# Patient Record
Sex: Female | Born: 1990 | Race: Black or African American | Hispanic: No | Marital: Single | Smoking: Never smoker
Health system: Southern US, Community
[De-identification: ages and names within clinical notes are randomized; demographics above are authoritative.]

## PROBLEM LIST (undated history)

## (undated) ENCOUNTER — Inpatient Hospital Stay: Payer: Self-pay

## (undated) DIAGNOSIS — Z862 Personal history of diseases of the blood and blood-forming organs and certain disorders involving the immune mechanism: Secondary | ICD-10-CM

## (undated) DIAGNOSIS — Z87440 Personal history of urinary (tract) infections: Secondary | ICD-10-CM

## (undated) DIAGNOSIS — L309 Dermatitis, unspecified: Secondary | ICD-10-CM

## (undated) DIAGNOSIS — R51 Headache: Secondary | ICD-10-CM

## (undated) DIAGNOSIS — D649 Anemia, unspecified: Secondary | ICD-10-CM

## (undated) DIAGNOSIS — R519 Headache, unspecified: Secondary | ICD-10-CM

## (undated) HISTORY — DX: Personal history of urinary (tract) infections: Z87.440

## (undated) HISTORY — DX: Headache: R51

## (undated) HISTORY — DX: Dermatitis, unspecified: L30.9

## (undated) HISTORY — DX: Headache, unspecified: R51.9

## (undated) HISTORY — DX: Personal history of diseases of the blood and blood-forming organs and certain disorders involving the immune mechanism: Z86.2

---
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
## 2010-08-06 ENCOUNTER — Ambulatory Visit: Payer: Self-pay | Admitting: Family Medicine

## 2010-10-20 ENCOUNTER — Observation Stay: Payer: Self-pay

## 2010-11-27 ENCOUNTER — Observation Stay: Payer: Self-pay

## 2011-01-09 ENCOUNTER — Inpatient Hospital Stay: Payer: Self-pay | Admitting: Obstetrics and Gynecology

## 2014-06-01 ENCOUNTER — Emergency Department: Payer: Self-pay | Admitting: Emergency Medicine

## 2015-12-28 NOTE — L&D Delivery Note (Signed)
Delivery Note At  0051 a viable and healthy female "Natalie Scott" was delivered via  (Presentation:OA ;  ).  APGAR:8 ,9 ; weight 6 # 12oz  .   Placenta status: delivered intact with three vessel Cord (delivered by myself):  with the following complications: precipitous vaginal delivery with Carlean JewsMeredith Sigmon, CNM in attendance, not enough time to administer GBS prophylaxis  Anesthesia:  none Episiotomy:  none Lacerations:  none Suture Repair: NA Est. Blood Loss (mL):  300  Mom to postpartum.  Baby to Couplet care / Skin to Skin.  Melody N Shambley 12/01/2016, 1:09 AM

## 2016-04-13 ENCOUNTER — Encounter: Payer: Self-pay | Admitting: Emergency Medicine

## 2016-04-13 ENCOUNTER — Emergency Department
Admission: EM | Admit: 2016-04-13 | Discharge: 2016-04-13 | Disposition: A | Discharge status: Home / Self Care | Payer: Medicaid Other | Attending: Emergency Medicine | Admitting: Emergency Medicine

## 2016-04-13 ENCOUNTER — Emergency Department: Payer: Medicaid Other

## 2016-04-13 DIAGNOSIS — R1030 Lower abdominal pain, unspecified: Secondary | ICD-10-CM | POA: Diagnosis present

## 2016-04-13 DIAGNOSIS — Z3490 Encounter for supervision of normal pregnancy, unspecified, unspecified trimester: Secondary | ICD-10-CM

## 2016-04-13 DIAGNOSIS — O26891 Other specified pregnancy related conditions, first trimester: Secondary | ICD-10-CM | POA: Diagnosis not present

## 2016-04-13 DIAGNOSIS — Z3A01 Less than 8 weeks gestation of pregnancy: Secondary | ICD-10-CM | POA: Diagnosis not present

## 2016-04-13 LAB — URINALYSIS COMPLETE WITH MICROSCOPIC (ARMC ONLY)
Bilirubin Urine: NEGATIVE
Glucose, UA: NEGATIVE mg/dL
Nitrite: NEGATIVE
Protein, ur: 100 mg/dL — AB
Specific Gravity, Urine: 1.029 (ref 1.005–1.030)
pH: 6 (ref 5.0–8.0)

## 2016-04-13 LAB — HCG, QUANTITATIVE, PREGNANCY: hCG, Beta Chain, Quant, S: 82075 m[IU]/mL — ABNORMAL HIGH

## 2016-04-13 LAB — COMPREHENSIVE METABOLIC PANEL
ALK PHOS: 41 U/L (ref 38–126)
ALT: 18 U/L (ref 14–54)
AST: 25 U/L (ref 15–41)
Albumin: 4 g/dL (ref 3.5–5.0)
Anion gap: 8 (ref 5–15)
BILIRUBIN TOTAL: 0.8 mg/dL (ref 0.3–1.2)
BUN: 10 mg/dL (ref 6–20)
CALCIUM: 9.4 mg/dL (ref 8.9–10.3)
CO2: 24 mmol/L (ref 22–32)
CREATININE: 0.6 mg/dL (ref 0.44–1.00)
Chloride: 103 mmol/L (ref 101–111)
GFR calc non Af Amer: >60 mL/min (ref >60)
GLUCOSE: 108 mg/dL — AB (ref 65–99)
Potassium: 3.3 mmol/L — ABNORMAL LOW (ref 3.5–5.1)
SODIUM: 135 mmol/L (ref 135–145)
Total Protein: 7.5 g/dL (ref 6.5–8.1)

## 2016-04-13 LAB — CBC
HCT: 32 % — ABNORMAL LOW (ref 35.0–47.0)
HEMOGLOBIN: 10.5 g/dL — AB (ref 12.0–16.0)
MCH: 25.9 pg — AB (ref 26.0–34.0)
MCHC: 32.7 g/dL (ref 32.0–36.0)
MCV: 79.2 fL — ABNORMAL LOW (ref 80.0–100.0)
PLATELETS: 241 10*3/uL (ref 150–440)
RBC: 4.04 MIL/uL (ref 3.80–5.20)
RDW: 15.7 % — ABNORMAL HIGH (ref 11.5–14.5)
WBC: 9.6 10*3/uL (ref 3.6–11.0)

## 2016-04-13 LAB — POCT PREGNANCY, URINE: Preg Test, Ur: POSITIVE — AB

## 2016-04-13 LAB — LIPASE, BLOOD: Lipase: 25 U/L (ref 11–51)

## 2016-04-13 MED ORDER — NITROFURANTOIN MACROCRYSTAL 100 MG PO CAPS: 100.0000 mg | ORAL_CAPSULE | Freq: Four times a day (QID) | ORAL | Status: DC

## 2016-04-13 NOTE — ED Provider Notes (Signed)
Time Seen: Approximately 1500 I have reviewed the triage notes  Chief Complaint: Abdominal Pain   History of Present Illness: Natalie Scott is a 25 y.o. female who is now currently gravida 2 para 1. She states her last normal menstrual period was approximately 8 weeks ago. She states she had some spotty vaginal bleeding 4 weeks ago and was considered to be an abnormal menstrual period. She describes some lower middle quadrant abdominal pain with nausea for the past 2 weeks. No persistent vomiting. She is also noticed some blood in her urine and some burning sensation when she urinates with urinary frequency. She denies any fever at home and she denies any loose stool or diarrhea, melena or hematochezia.   History reviewed. No pertinent past medical history. She states her last pregnancy was uneventful and had no issues with hypertension or diabetes There are no active problems to display for this patient.   History reviewed. No pertinent past surgical history.  History reviewed. No pertinent past surgical history.  No current outpatient prescriptions on file.  Allergies:  Review of patient's allergies indicates no known allergies.  Family History: History reviewed. No pertinent family history.  Social History: Social History  Substance Use Topics  . Smoking status: Never Smoker   . Smokeless tobacco: None  . Alcohol Use: No     Review of Systems:   10 point review of systems was performed and was otherwise negative:  Constitutional: No fever Eyes: No visual disturbances ENT: No sore throat, ear pain Cardiac: No chest pain Respiratory: No shortness of breath, wheezing, or stridor Abdomen: No abdominal pain, no vomiting, No diarrhea Endocrine: No weight loss, No night sweats Extremities: No peripheral edema, cyanosis Skin: No rashes, easy bruising Neurologic: No focal weakness, trouble with speech or swollowing Urologic: No dysuria, Hematuria, or urinary  frequency   Physical Exam:  ED Triage Vitals  Enc Vitals Group     BP 04/13/16 1157 113/77 mmHg     Pulse Rate 04/13/16 1157 86     Resp 04/13/16 1157 20     Temp 04/13/16 1157 98.3 F (36.8 C)     Temp Source 04/13/16 1157 Oral     SpO2 04/13/16 1157 100 %     Weight 04/13/16 1157 117 lb (53.071 kg)     Height 04/13/16 1157  (1.702 m)     Head Cir --      Peak Flow --      Pain Score 04/13/16 1158 3     Pain Loc --      Pain Edu? --      Excl. in GC? --     General: Awake , Alert , and Oriented times 3; GCS 15 Head: Normal cephalic , atraumatic Eyes: Pupils equal , round, reactive to light Nose/Throat: No nasal drainage, patent upper airway without erythema or exudate.  Neck: Supple, Full range of motion, No anterior adenopathy or palpable thyroid masses Lungs: Clear to ascultation without wheezes , rhonchi, or rales Heart: Regular rate, regular rhythm without murmurs , gallops , or rubs Abdomen: Soft, non tender without rebound, guarding , or rigidity; bowel sounds positive and symmetric in all 4 quadrants. No organomegaly .        Extremities: 2 plus symmetric pulses. No edema, clubbing or cyanosis Neurologic: normal ambulation, Motor symmetric without deficits, sensory intact Skin: warm, dry, no rashes Pelvic exam was deferred for ultrasound evaluation  Labs:   All laboratory work was reviewed including any pertinent  negatives or positives listed below:  Labs Reviewed  COMPREHENSIVE METABOLIC PANEL - Abnormal; Notable for the following:    Potassium 3.3 (*)    Glucose, Bld 108 (*)    All other components within normal limits  CBC - Abnormal; Notable for the following:    Hemoglobin 10.5 (*)    HCT 32.0 (*)    MCV 79.2 (*)    MCH 25.9 (*)    RDW 15.7 (*)    All other components within normal limits  URINALYSIS COMPLETEWITH MICROSCOPIC ([facility] ONLY) - Abnormal; Notable for the following:    Color, Urine YELLOW (*)    APPearance CLOUDY (*)    Ketones, ur 2+  (*)    Hgb urine dipstick 3+ (*)    Protein, ur 100 (*)    Leukocytes, UA 3+ (*)    Bacteria, UA FEW (*)    Squamous Epithelial / LPF 6-30 (*)    All other components within normal limits  POCT PREGNANCY, URINE - Abnormal; Notable for the following:    Preg Test, Ur POSITIVE (*)    All other components within normal limits  URINE CULTURE  LIPASE, BLOOD  HCG, QUANTITATIVE, PREGNANCY  POC URINE PREG, ED   and culture is pending  Radiology: *     EDC by LMP is12/13/2017.  EXAM: OBSTETRIC <14 WK US AND TRANSVAGINAL OB US  TECHNIQUE: Both transabdominal and transvaginal ultrasound examinations were performed for complete evaluation of the gestation as well as the maternal uterus, adnexal regions, and pelvic cul-de-sac. Transvaginal technique was performed to assess early pregnancy.  COMPARISON: None.  FINDINGS: Intrauterine gestational sac: Present  Yolk sac: Present  Embryo: Present  Cardiac Activity: Present  Heart Rate: 132 bpm  CRL: 6.3 mm  6 w  3 d         US EDC: 12/04/2016  Subchorionic hemorrhage: None  Maternal uterus/adnexae: Normal appearance of the left ovary. Mildly complex cystic structure within the right ovary, measuring 1.4 x 2.2 cm. Internal septations are present. Findings likely related to an involuting corpus luteum cyst. Trace free pelvic fluid.  IMPRESSION: 1. Single living intrauterine embryo. 2. Today's ultrasound confirms clinical EDC of 12/08/2016. 3. Probable right corpus luteum cyst.   Electronically Signed By: Norva PavlovElizabeth Brown M.D. On: 04/13/2016 16:38 I personally reviewed the radiologic studies   P    ED Course: * Patient's stay here was uneventful and she was evaluated for lower abdominal pain with pregnancy. She does not appear to have an ectopic pregnancy and does appear to have a urinary tract infection. Afebrile and earlier pregnancy and I felt there was no risk for pyelonephritis at this  time. She also does not appear to have any complications from her pregnancy at this time and was referred to OB/GYN unassigned. Last drink plenty of fluids and return here if she has any other new concerns.  Patient was prescribed nitrofurantoin for urinary tract infection    Assessment:  First trimester pregnancy Urinary tract infection   F  Plan:  Outpatient management Patient was advised to return immediately if condition worsens. Patient was advised to follow up with their primary care physician or other specialized physicians involved in their outpatient care. The patient and/or family member/power of attorney had laboratory results reviewed at the bedside. All questions and concerns were addressed and appropriate discharge instructions were distributed by the nursing staff.             Jennye MoccasinBrian S Quigley, MD 04/13/16 [phone]

## 2016-04-13 NOTE — ED Notes (Signed)
Pt to ed with c/o abd pain and nausea x 2 weeks.

## 2016-04-13 NOTE — ED Notes (Signed)
Pt informed to return if any life threatening symptoms occur.  

## 2016-04-13 NOTE — Discharge Instructions (Signed)
First Trimester of Pregnancy The first trimester of pregnancy is from week 1 until the end of week 12 (months 1 through 3). A week after a sperm fertilizes an egg, the egg will implant on the wall of the uterus. This embryo will begin to develop into a baby. Genes from you and your partner are forming the baby. The female genes determine whether the baby is a boy or a girl. At 6-8 weeks, the eyes and face are formed, and the heartbeat can be seen on ultrasound. At the end of 12 weeks, all the baby's organs are formed.  Now that you are pregnant, you will want to do everything you can to have a healthy baby. Two of the most important things are to get good prenatal care and to follow your health care provider's instructions. Prenatal care is all the medical care you receive before the baby's birth. This care will help prevent, find, and treat any problems during the pregnancy and childbirth. BODY CHANGES Your body goes through many changes during pregnancy. The changes vary from woman to woman.   You may gain or lose a couple of pounds at first.  You may feel sick to your stomach (nauseous) and throw up (vomit). If the vomiting is uncontrollable, call your health care provider.  You may tire easily.  You may develop headaches that can be relieved by medicines approved by your health care provider.  You may urinate more often. Painful urination may mean you have a bladder infection.  You may develop heartburn as a result of your pregnancy.  You may develop constipation because certain hormones are causing the muscles that push waste through your intestines to slow down.  You may develop hemorrhoids or swollen, bulging veins (varicose veins).  Your breasts may begin to grow larger and become tender. Your nipples may stick out more, and the tissue that surrounds them (areola) may become darker.  Your gums may bleed and may be sensitive to brushing and flossing.  Dark spots or blotches (chloasma,  mask of pregnancy) may develop on your face. This will likely fade after the baby is born.  Your menstrual periods will stop.  You may have a loss of appetite.  You may develop cravings for certain kinds of food.  You may have changes in your emotions from day to day, such as being excited to be pregnant or being concerned that something may go wrong with the pregnancy and baby.  You may have more vivid and strange dreams.  You may have changes in your hair. These can include thickening of your hair, rapid growth, and changes in texture. Some women also have hair loss during or after pregnancy, or hair that feels dry or thin. Your hair will most likely return to normal after your baby is born. WHAT TO EXPECT AT YOUR PRENATAL VISITS During a routine prenatal visit:  You will be weighed to make sure you and the baby are growing normally.  Your blood pressure will be taken.  Your abdomen will be measured to track your baby's growth.  The fetal heartbeat will be listened to starting around week 10 or 12 of your pregnancy.  Test results from any previous visits will be discussed. Your health care provider may ask you:  How you are feeling.  If you are feeling the baby move.  If you have had any abnormal symptoms, such as leaking fluid, bleeding, severe headaches, or abdominal cramping.  If you are using any tobacco products,   including cigarettes, chewing tobacco, and electronic cigarettes.  If you have any questions. Other tests that may be performed during your first trimester include:  Blood tests to find your blood type and to check for the presence of any previous infections. They will also be used to check for low iron levels (anemia) and Rh antibodies. Later in the pregnancy, blood tests for diabetes will be done along with other tests if problems develop.  Urine tests to check for infections, diabetes, or protein in the urine.  An ultrasound to confirm the proper growth  and development of the baby.  An amniocentesis to check for possible genetic problems.  Fetal screens for spina bifida and Down syndrome.  You may need other tests to make sure you and the baby are doing well.  HIV (human immunodeficiency virus) testing. Routine prenatal testing includes screening for HIV, unless you choose not to have this test. HOME CARE INSTRUCTIONS  Medicines  Follow your health care provider's instructions regarding medicine use. Specific medicines may be either safe or unsafe to take during pregnancy.  Take your prenatal vitamins as directed.  If you develop constipation, try taking a stool softener if your health care provider approves. Diet  Eat regular, well-balanced meals. Choose a variety of foods, such as meat or vegetable-based protein, fish, milk and low-fat dairy products, vegetables, fruits, and whole grain breads and cereals. Your health care provider will help you determine the amount of weight gain that is right for you.  Avoid raw meat and uncooked cheese. These carry germs that can cause birth defects in the baby.  Eating four or five small meals rather than three large meals a day may help relieve nausea and vomiting. If you start to feel nauseous, eating a few soda crackers can be helpful. Drinking liquids between meals instead of during meals also seems to help nausea and vomiting.  If you develop constipation, eat more high-fiber foods, such as fresh vegetables or fruit and whole grains. Drink enough fluids to keep your urine clear or pale yellow. Activity and Exercise  Exercise only as directed by your health care provider. Exercising will help you:  Control your weight.  Stay in shape.  Be prepared for labor and delivery.  Experiencing pain or cramping in the lower abdomen or low back is a good sign that you should stop exercising. Check with your health care provider before continuing normal exercises.  Try to avoid standing for long  periods of time. Move your legs often if you must stand in one place for a long time.  Avoid heavy lifting.  Wear low-heeled shoes, and practice good posture.  You may continue to have sex unless your health care provider directs you otherwise. Relief of Pain or Discomfort  Wear a good support bra for breast tenderness.   Take warm sitz baths to soothe any pain or discomfort caused by hemorrhoids. Use hemorrhoid cream if your health care provider approves.   Rest with your legs elevated if you have leg cramps or low back pain.  If you develop varicose veins in your legs, wear support hose. Elevate your feet for 15 minutes, 3-4 times a day. Limit salt in your diet. Prenatal Care  Schedule your prenatal visits by the twelfth week of pregnancy. They are usually scheduled monthly at first, then more often in the last 2 months before delivery.  Write down your questions. Take them to your prenatal visits.  Keep all your prenatal visits as directed by your   health care provider. Safety  Wear your seat belt at all times when driving.  Make a list of emergency phone numbers, including numbers for family, friends, the hospital, and police and fire departments. General Tips  Ask your health care provider for a referral to a local prenatal education class. Begin classes no later than at the beginning of month 6 of your pregnancy.  Ask for help if you have counseling or nutritional needs during pregnancy. Your health care provider can offer advice or refer you to specialists for help with various needs.  Do not use hot tubs, steam rooms, or saunas.  Do not douche or use tampons or scented sanitary pads.  Do not cross your legs for long periods of time.  Avoid cat litter boxes and soil used by cats. These carry germs that can cause birth defects in the baby and possibly loss of the fetus by miscarriage or stillbirth.  Avoid all smoking, herbs, alcohol, and medicines not prescribed by  your health care provider. Chemicals in these affect the formation and growth of the baby.  Do not use any tobacco products, including cigarettes, chewing tobacco, and electronic cigarettes. If you need help quitting, ask your health care provider. You may receive counseling support and other resources to help you quit.  Schedule a dentist appointment. At home, brush your teeth with a soft toothbrush and be gentle when you floss. SEEK MEDICAL CARE IF:   You have dizziness.  You have mild pelvic cramps, pelvic pressure, or nagging pain in the abdominal area.  You have persistent nausea, vomiting, or diarrhea.  You have a bad smelling vaginal discharge.  You have pain with urination.  You notice increased swelling in your face, hands, legs, or ankles. SEEK IMMEDIATE MEDICAL CARE IF:   You have a fever.  You are leaking fluid from your vagina.  You have spotting or bleeding from your vagina.  You have severe abdominal cramping or pain.  You have rapid weight gain or loss.  You vomit blood or material that looks like coffee grounds.  You are exposed to German measles and have never had them.  You are exposed to fifth disease or chickenpox.  You develop a severe headache.  You have shortness of breath.  You have any kind of trauma, such as from a fall or a car accident.   This information is not intended to replace advice given to you by your health care provider. Make sure you discuss any questions you have with your health care provider.   Document Released: 12/07/2001 Document Revised: 01/03/2015 Document Reviewed: 10/23/2013 Elsevier Interactive Patient Education 2016 Elsevier Inc.  

## 2016-04-15 LAB — URINE CULTURE

## 2016-04-27 ENCOUNTER — Ambulatory Visit (INDEPENDENT_AMBULATORY_CARE_PROVIDER_SITE_OTHER): Payer: Medicaid Other | Admitting: Obstetrics and Gynecology

## 2016-04-27 VITALS — BP 108/67 | HR 93 | Wt 117.2 lb

## 2016-04-27 DIAGNOSIS — Z1389 Encounter for screening for other disorder: Secondary | ICD-10-CM

## 2016-04-27 DIAGNOSIS — Z331 Pregnant state, incidental: Secondary | ICD-10-CM

## 2016-04-27 DIAGNOSIS — Z36 Encounter for antenatal screening of mother: Secondary | ICD-10-CM

## 2016-04-27 DIAGNOSIS — Z369 Encounter for antenatal screening, unspecified: Secondary | ICD-10-CM

## 2016-04-27 DIAGNOSIS — Z3490 Encounter for supervision of normal pregnancy, unspecified, unspecified trimester: Secondary | ICD-10-CM

## 2016-04-27 DIAGNOSIS — Z113 Encounter for screening for infections with a predominantly sexual mode of transmission: Secondary | ICD-10-CM

## 2016-04-27 NOTE — Progress Notes (Signed)
Natalie Scott presents for NOB nurse interview visit. Confirmation received from ER visit on 04/13/2016, UPT: positive. Pt had went to ER originally for back pain and then found out she was pregnant.  Had ultrasound with noted viable pregnancy on 04/13/2016 with EDD: 12/04/2016. LMP: 03/03/2016. Pt did not want to give fob name because she is no longer involved with him. He wanted her to have an abortion. G-2.  P-1001. Pregnancy education material explained and given.  No cats in the home. NOB labs ordered.  HIV labs and Drug screen were explained optional and she could opt out of tests but did not decline. Drug screen ordered. PNV encouraged. NT to discuss with provider. Pt given sample of pnv (Concept DHA) and to take otc vitamin B6 3xd and unisom at bedtime. If she wishes we can order phenergan or zofran if this does not help.  Pt. To follow up with provider in 4 weeks for NOB physical.  All questions answered.    ZIKA EXPOSURE SCREEN:  The patient has not traveled to a [place] endemic area within the past 6 months, nor has she had unprotected sex with a partner who has travelled to a [place] endemic region within the past 6 months. The patient has been advised to notify us if these factors change any time during this current pregnancy, so adequate testing and monitoring can be initiated.

## 2016-04-27 NOTE — Addendum Note (Signed)
Addended by: Jackquline DenmarkIDGEWAY, Jaquawn Saffran W on: 04/27/2016 12:42 PM   Modules accepted: Orders

## 2016-04-28 ENCOUNTER — Other Ambulatory Visit: Payer: Self-pay | Admitting: Obstetrics and Gynecology

## 2016-04-28 DIAGNOSIS — Z283 Underimmunization status: Secondary | ICD-10-CM | POA: Insufficient documentation

## 2016-04-28 DIAGNOSIS — O09899 Supervision of other high risk pregnancies, unspecified trimester: Secondary | ICD-10-CM

## 2016-04-28 DIAGNOSIS — Z2839 Other underimmunization status: Secondary | ICD-10-CM

## 2016-04-28 DIAGNOSIS — O9989 Other specified diseases and conditions complicating pregnancy, childbirth and the puerperium: Principal | ICD-10-CM

## 2016-04-28 LAB — RUBELLA ANTIBODY, IGM

## 2016-04-28 LAB — URINALYSIS, ROUTINE W REFLEX MICROSCOPIC
Bilirubin, UA: NEGATIVE
Glucose, UA: NEGATIVE
Ketones, UA: NEGATIVE
NITRITE UA: NEGATIVE
PH UA: 6 (ref 5.0–7.5)
PROTEIN UA: NEGATIVE
RBC, UA: NEGATIVE
Specific Gravity, UA: 1.023 (ref 1.005–1.030)
UUROB: 0.2 mg/dL (ref 0.2–1.0)

## 2016-04-28 LAB — CBC WITH DIFFERENTIAL/PLATELET
BASOS ABS: 0 10*3/uL (ref 0.0–0.2)
Basos: 0 %
EOS (ABSOLUTE): 0 10*3/uL (ref 0.0–0.4)
Eos: 0 %
Hematocrit: 34.9 % (ref 34.0–46.6)
Hemoglobin: 11.4 g/dL (ref 11.1–15.9)
Immature Grans (Abs): 0 10*3/uL (ref 0.0–0.1)
Immature Granulocytes: 0 %
LYMPHS ABS: 1.7 10*3/uL (ref 0.7–3.1)
Lymphs: 17 %
MCH: 26.3 pg — ABNORMAL LOW (ref 26.6–33.0)
MCHC: 32.7 g/dL (ref 31.5–35.7)
MCV: 80 fL (ref 79–97)
MONOS ABS: 0.9 10*3/uL (ref 0.1–0.9)
Monocytes: 9 %
NEUTROS PCT: 74 %
Neutrophils Absolute: 7.2 10*3/uL — ABNORMAL HIGH (ref 1.4–7.0)
PLATELETS: 296 10*3/uL (ref 150–379)
RBC: 4.34 x10E6/uL (ref 3.77–5.28)
RDW: 16.1 % — ABNORMAL HIGH (ref 12.3–15.4)
WBC: 9.8 10*3/uL (ref 3.4–10.8)

## 2016-04-28 LAB — HEPATITIS B SURFACE ANTIGEN: Hepatitis B Surface Ag: NEGATIVE

## 2016-04-28 LAB — NICOTINE SCREEN, URINE: COTININE UR QL SCN: NEGATIVE ng/mL

## 2016-04-28 LAB — MICROSCOPIC EXAMINATION

## 2016-04-28 LAB — HIV ANTIBODY (ROUTINE TESTING W REFLEX): HIV Screen 4th Generation wRfx: NONREACTIVE

## 2016-04-28 LAB — VARICELLA ZOSTER ANTIBODY, IGM

## 2016-04-28 LAB — SICKLE CELL SCREEN: Sickle Cell Screen: NEGATIVE

## 2016-04-28 LAB — RPR: RPR Ser Ql: NONREACTIVE

## 2016-04-28 LAB — RH TYPE: Rh Factor: POSITIVE

## 2016-04-28 LAB — ANTIBODY SCREEN: ANTIBODY SCREEN: NEGATIVE

## 2016-04-28 LAB — ABO

## 2016-04-29 LAB — URINE CULTURE, OB REFLEX

## 2016-04-29 LAB — GC/CHLAMYDIA PROBE AMP
Chlamydia trachomatis, NAA: NEGATIVE
NEISSERIA GONORRHOEAE BY PCR: NEGATIVE

## 2016-04-29 LAB — CULTURE, OB URINE

## 2016-04-30 LAB — MONITOR DRUG PROFILE 14(MW)
AMPHETAMINE SCREEN URINE: NEGATIVE ng/mL
BARBITURATE SCREEN URINE: NEGATIVE ng/mL
BENZODIAZEPINE SCREEN, URINE: NEGATIVE ng/mL
Buprenorphine, Urine: NEGATIVE ng/mL
Cocaine (Metab) Scrn, Ur: NEGATIVE ng/mL
Creatinine(Crt), U: 233.4 mg/dL (ref 20.0–300.0)
Fentanyl, Urine: NEGATIVE pg/mL
MEPERIDINE SCREEN, URINE: NEGATIVE ng/mL
Methadone Screen, Urine: NEGATIVE ng/mL
OXYCODONE+OXYMORPHONE UR QL SCN: NEGATIVE ng/mL
Opiate Scrn, Ur: NEGATIVE ng/mL
Ph of Urine: 5.7 (ref 4.5–8.9)
Phencyclidine Qn, Ur: NEGATIVE ng/mL
Propoxyphene Scrn, Ur: NEGATIVE ng/mL
SPECIFIC GRAVITY: 1.026
TRAMADOL SCREEN, URINE: NEGATIVE ng/mL

## 2016-04-30 LAB — CANNABINOID (GC/MS), URINE
CANNABINOID UR: POSITIVE — AB
Carboxy THC (GC/MS): 240 ng/mL

## 2016-05-04 ENCOUNTER — Other Ambulatory Visit: Payer: Self-pay | Admitting: Obstetrics and Gynecology

## 2016-05-04 DIAGNOSIS — F1290 Cannabis use, unspecified, uncomplicated: Secondary | ICD-10-CM | POA: Insufficient documentation

## 2016-05-13 ENCOUNTER — Other Ambulatory Visit: Payer: Self-pay | Admitting: Obstetrics and Gynecology

## 2016-05-27 ENCOUNTER — Ambulatory Visit (INDEPENDENT_AMBULATORY_CARE_PROVIDER_SITE_OTHER): Payer: Medicaid Other

## 2016-05-27 ENCOUNTER — Ambulatory Visit (INDEPENDENT_AMBULATORY_CARE_PROVIDER_SITE_OTHER): Payer: Medicaid Other | Admitting: Obstetrics and Gynecology

## 2016-05-27 ENCOUNTER — Other Ambulatory Visit: Payer: Self-pay | Admitting: Obstetrics and Gynecology

## 2016-05-27 ENCOUNTER — Encounter: Payer: Self-pay | Admitting: Obstetrics and Gynecology

## 2016-05-27 VITALS — BP 101/58 | HR 102 | Wt 126.7 lb

## 2016-05-27 DIAGNOSIS — Z3492 Encounter for supervision of normal pregnancy, unspecified, second trimester: Secondary | ICD-10-CM | POA: Diagnosis not present

## 2016-05-27 DIAGNOSIS — Z3481 Encounter for supervision of other normal pregnancy, first trimester: Secondary | ICD-10-CM | POA: Diagnosis not present

## 2016-05-27 LAB — POCT URINALYSIS DIPSTICK
Bilirubin, UA: NEGATIVE
Glucose, UA: NEGATIVE
KETONES UA: NEGATIVE
Leukocytes, UA: NEGATIVE
Nitrite, UA: NEGATIVE
PH UA: 7
RBC UA: NEGATIVE
SPEC GRAV UA: 1.015
Urobilinogen, UA: 0.2

## 2016-05-27 NOTE — Patient Instructions (Signed)

## 2016-05-27 NOTE — Progress Notes (Signed)
NOB- pt states she feels like she is further along, " almost feels like she feels baby in two different spots, twins run in the family"

## 2016-05-27 NOTE — Progress Notes (Signed)
Indications:First Trimester Screen - NT Findings:  Singleton intrauterine pregnancy is visualized with a CRL consistent with 13 1/[redacted] weeks gestation, giving an (U/S) EDD of 12/01/16.  FHR: 157 CRL measurement: 69.1 mm NT measurement: 2.8 mm. Yolk sac and and early anatomy is normal.  Right Ovary measures 2.8 x 1.4 x 1.6 cm. It is normal in appearance. Left Ovary measures 3 x 1.2 x 2.1 cm. It is normal appearance. There is evidence of a corpus luteal cyst in the Left Survey of the adnexa demonstrates no adnexal masses. There is no free peritoneal fluid in the cul de sac.  Impression: 1. 13 1/7 week Viable Singleton Intrauterine pregnancy by U/S. 2. (U/S) EDD is consistent with  12/01/16. 3. NT Screen successfully completed.

## 2016-06-02 ENCOUNTER — Telehealth: Payer: Self-pay | Admitting: Obstetrics and Gynecology

## 2016-06-02 NOTE — Telephone Encounter (Signed)
Patient needs script sent in for prenatal vitamins sent to [facility] on [address].Thanks

## 2016-06-03 MED ORDER — PRENATAL MULTIVITAMIN CH: 1.0000 | ORAL_TABLET | Freq: Every day | ORAL | Status: DC

## 2016-06-03 MED ORDER — CONCEPT OB 130-92.4-1 MG PO CAPS: 1.0000 | ORAL_CAPSULE | Freq: Every day | ORAL | Status: DC

## 2016-06-03 NOTE — Telephone Encounter (Signed)
KC made pt aware - med erx.

## 2016-06-03 NOTE — Addendum Note (Signed)
Addended by: Marchelle FolksMILLER, Karessa Onorato G on: 06/03/2016 04:52 PM   Modules accepted: Orders

## 2016-06-10 LAB — FIRST TRIMESTER SCREEN W/NT
CRL: 69.1 mm
DIA Value: <30 pg/mL
GEST AGE-COLLECT: 12.9 wk
MATERNAL AGE AT EDD: 27.5 a
NUCHAL TRANSLUCENCY: 2.8 mm
Nuchal Translucency MoM: 1.64
Number of Fetuses: 1
PAPP-A VALUE: 1.6 ng/mL
PDF: 0
Test Results:: POSITIVE — AB
WEIGHT: 117 [lb_av]

## 2016-06-15 ENCOUNTER — Ambulatory Visit (INDEPENDENT_AMBULATORY_CARE_PROVIDER_SITE_OTHER): Payer: Medicaid Other | Admitting: Obstetrics and Gynecology

## 2016-06-15 ENCOUNTER — Encounter: Payer: Self-pay | Admitting: Obstetrics and Gynecology

## 2016-06-15 VITALS — BP 117/73 | HR 111 | Wt 136.0 lb

## 2016-06-15 DIAGNOSIS — O283 Abnormal ultrasonic finding on antenatal screening of mother: Secondary | ICD-10-CM

## 2016-06-15 DIAGNOSIS — Z3492 Encounter for supervision of normal pregnancy, unspecified, second trimester: Secondary | ICD-10-CM

## 2016-06-15 DIAGNOSIS — O288 Other abnormal findings on antenatal screening of mother: Secondary | ICD-10-CM

## 2016-06-15 LAB — POCT URINALYSIS DIPSTICK
Bilirubin, UA: NEGATIVE
Blood, UA: NEGATIVE
Glucose, UA: NEGATIVE
Ketone: NEGATIVE
Nitrite, UA: NEGATIVE
PROTEIN UA: NEGATIVE
Spec Grav, UA: <=1.005
UROBILINOGEN UA: 0.2
pH, UA: 6

## 2016-06-15 NOTE — Progress Notes (Signed)
Chief complaint: 1.  Nuchal translucency test results.  Patient presents in the company of her partner for discussion of nuchal translucency testing results. Gestational age was confirmed by early ultrasound at 6.3 weeks on 04/13/2016.  Maternal age risk 1 in 30852. Screening Risk 1 in 6039  The risk for trisomy 18 is not increased.    ASSESSMENT: 1.  15.5 week intrauterine pregnancy. 2.  Increased risk for Down syndrome at 1 and 39, based on nuchal translucency screening.  PLAN: 1.  Questions answered. 2.  [facility]  A total of 15 minutes were spent face-to-face with the patient during this encounter and over half of that time dealt with counseling and coordination of care.  Herold HarmsMartin A Sophia Sperry, MD  Note: This dictation was prepared with Dragon dictation along with smaller phrase technology. Any transcriptional errors that result from this process are unintentional.

## 2016-06-15 NOTE — Patient Instructions (Addendum)
1. [facility] for [facility] counseling And Amniocentesis Due To Screen Positive Risk for Down Syndrome on Nuchal Translucency Testing

## 2016-06-16 ENCOUNTER — Other Ambulatory Visit: Payer: Self-pay | Admitting: Obstetrics and Gynecology

## 2016-06-16 DIAGNOSIS — O289 Unspecified abnormal findings on antenatal screening of mother: Secondary | ICD-10-CM

## 2016-06-17 ENCOUNTER — Other Ambulatory Visit: Payer: Self-pay | Admitting: Obstetrics and Gynecology

## 2016-06-17 DIAGNOSIS — Z3689 Encounter for other specified antenatal screening: Secondary | ICD-10-CM

## 2016-06-24 ENCOUNTER — Ambulatory Visit (INDEPENDENT_AMBULATORY_CARE_PROVIDER_SITE_OTHER): Payer: Medicaid Other | Admitting: Obstetrics and Gynecology

## 2016-06-24 ENCOUNTER — Encounter: Payer: Self-pay | Admitting: Obstetrics and Gynecology

## 2016-06-24 VITALS — BP 105/63 | HR 97 | Wt 138.6 lb

## 2016-06-24 DIAGNOSIS — Z3492 Encounter for supervision of normal pregnancy, unspecified, second trimester: Secondary | ICD-10-CM

## 2016-06-24 LAB — POCT URINALYSIS DIPSTICK
Bilirubin, UA: NEGATIVE
Blood, UA: NEGATIVE
Glucose, UA: NEGATIVE
KETONES UA: NEGATIVE
Nitrite, UA: NEGATIVE
PH UA: 6.5
PROTEIN UA: NEGATIVE
SPEC GRAV UA: 1.015
UROBILINOGEN UA: 0.2

## 2016-06-24 NOTE — Progress Notes (Signed)
ROB- still feeling fatigue and headaches-to try tylenol with caffeine and heat to neck- sounds tension.

## 2016-06-24 NOTE — Progress Notes (Signed)
ROB- pt is having headaches every day, tylenol isnt helping

## 2016-07-08 ENCOUNTER — Telehealth: Payer: Self-pay | Admitting: Obstetrics and Gynecology

## 2016-07-08 NOTE — Telephone Encounter (Signed)
Referring physician:  [facility]  I spoke with Ms. Laural BenesJohnson regarding her appointment scheduled for [facility] on Monday, July 17 for an ultrasound and MFM consultation.  She was not scheduled for genetic counseling to discuss her abnormal first trimester screening results and no genetic counselor will be available in clinic on Monday.  I therefore reviewed her first trimester screening results briefly by phone including the 1 in 39 risk for Down syndrome based upon the screening.  This result is unusual in that her hCG and DIA values were too low to be included in the risk assessment.  While this could be an accurate representation of this pregnancy, it is also suspicious for an issue with the sample itself.  The NT measurement was 2.538mm.  We spoke with [facility] and could have considered a repeat blood sample, but it is now too late for that given her gestational age.  I therefore offered her the options of ultrasound only, cell free DNA testing and amniocentesis.  The patient is not interested in amniocentesis due to the risks unless there are ultrasound findings or other labs suggestive of a chromosome condition.  She would like to proceed with her ultrasound here on Monday and have cell free DNA testing ordered at that time.  Dr. Leone BrandGrotegut will meet with her following her ultrasound on Monday to review those results and any questions.  He can schedule a formal genetic counseling visit for after her NIPT results become available if desired.  A formal family history and pregnancy history were not obtained during this call.  We can be reached at [phone].  Cherly Andersoneborah F. Tisha Cline, MS, CGC

## 2016-07-12 ENCOUNTER — Other Ambulatory Visit: Payer: Self-pay | Admitting: Obstetrics and Gynecology

## 2016-07-12 ENCOUNTER — Ambulatory Visit (HOSPITAL_BASED_OUTPATIENT_CLINIC_OR_DEPARTMENT_OTHER)
Admission: RE | Admit: 2016-07-12 | Disposition: A | Discharge status: Home / Self Care | Payer: Medicaid Other | Source: Ambulatory Visit | Attending: Obstetrics and Gynecology | Admitting: Obstetrics and Gynecology

## 2016-07-12 ENCOUNTER — Ambulatory Visit
Admission: RE | Admit: 2016-07-12 | Discharge: 2016-07-12 | Disposition: A | Discharge status: Home / Self Care | Payer: Medicaid Other | Source: Ambulatory Visit | Attending: Obstetrics & Gynecology | Admitting: Obstetrics & Gynecology

## 2016-07-12 VITALS — BP 105/65 | HR 93 | Temp 98.3°F | Resp 17 | Ht 67.5 in | Wt 144.0 lb

## 2016-07-12 DIAGNOSIS — Z315 Encounter for genetic counseling: Secondary | ICD-10-CM | POA: Insufficient documentation

## 2016-07-12 DIAGNOSIS — O9989 Other specified diseases and conditions complicating pregnancy, childbirth and the puerperium: Secondary | ICD-10-CM

## 2016-07-12 DIAGNOSIS — Z3A18 18 weeks gestation of pregnancy: Secondary | ICD-10-CM | POA: Diagnosis not present

## 2016-07-12 DIAGNOSIS — O289 Unspecified abnormal findings on antenatal screening of mother: Secondary | ICD-10-CM | POA: Diagnosis present

## 2016-07-12 DIAGNOSIS — O09899 Supervision of other high risk pregnancies, unspecified trimester: Secondary | ICD-10-CM

## 2016-07-12 DIAGNOSIS — Z283 Underimmunization status: Secondary | ICD-10-CM

## 2016-07-12 DIAGNOSIS — Z2839 Other underimmunization status: Secondary | ICD-10-CM

## 2016-07-12 DIAGNOSIS — Z3689 Encounter for other specified antenatal screening: Secondary | ICD-10-CM

## 2016-07-12 NOTE — Progress Notes (Signed)
[facility]: See ultrasound report.  Saul Fabiano, [place] A, MD

## 2016-07-18 LAB — INFORMASEQ(SM) WITH XY ANALYSIS
Fetal Fraction (%):: 9.7
Fetal Number: 1
Gestational Age at Collection: 19.7 wk
Weight: 144 [lb_av]

## 2016-07-19 ENCOUNTER — Telehealth: Payer: Self-pay | Admitting: Obstetrics and Gynecology

## 2016-07-19 NOTE — Telephone Encounter (Signed)
Natalie Scott was seen in the [facility] for a detailed ultrasound due to abnormal first trimester screening results which revealed an increased risk for Down syndrome.  Following the normal ultrasound and speaking with myself by phone prior to the visit, Dr. Leone Brand ordered InformaSeq testing to further assess for aneuploidy on July 12, 2016.  Today, the patient was informed of the results of her recent InformaSeq testing (performed at [facility]) which yielded NEGATIVE results.  The patient's specimen showed DNA consistent with two copies of chromosomes 21, 18 and 13.  The sensitivity for trisomy 43, trisomy 17 and trisomy 47 using this testing are reported as 99.1%, 98.3% and 98.1% respectively.  Thus, while the results of this testing are highly accurate, they are not considered diagnostic at this time.  Should more definitive information be desired, the patient may still consider amniocentesis.   As requested to know by the patient, sex chromosome analysis was included for this sample.  Results was consistent with a female fetus (XX). This is predicted with >97% accuracy.  A maternal serum AFP only should be considered if screening for neural tube defects is desired.  The patient declined additional formal genetic counseling and does not desired invasive prenatal diagnosis given these normal results. We are happy to speak with her further if desired and may be reachd at [phone].  Cherly Anderson, MS, CGC

## 2016-07-22 ENCOUNTER — Ambulatory Visit (INDEPENDENT_AMBULATORY_CARE_PROVIDER_SITE_OTHER): Payer: Medicaid Other | Admitting: Obstetrics and Gynecology

## 2016-07-22 VITALS — BP 116/68 | HR 102 | Wt 150.8 lb

## 2016-07-22 DIAGNOSIS — H10023 Other mucopurulent conjunctivitis, bilateral: Secondary | ICD-10-CM

## 2016-07-22 DIAGNOSIS — Z3492 Encounter for supervision of normal pregnancy, unspecified, second trimester: Secondary | ICD-10-CM

## 2016-07-22 LAB — POCT URINALYSIS DIPSTICK
Bilirubin, UA: NEGATIVE
GLUCOSE UA: NEGATIVE
KETONES UA: NEGATIVE
Nitrite, UA: NEGATIVE
Protein, UA: NEGATIVE
RBC UA: NEGATIVE
SPEC GRAV UA: 1.015
UROBILINOGEN UA: 0.2
pH, UA: 6

## 2016-07-22 MED ORDER — CIPROFLOXACIN-FLUOCINOLONE PF 0.3-0.025 % OT SOLN: 0.2500 mL | Freq: Two times a day (BID) | OTIC | Status: DC

## 2016-07-22 NOTE — Progress Notes (Signed)
ROB- pt is having B eye pain, matted in the am, feels like sand in her eyes x 2 days

## 2016-07-22 NOTE — Progress Notes (Signed)
ROB- reviewed anatomy scan and MFM consult. Normal female noted on scan. Will continue with routine prenatal care. Does have pink eye bilaterally and erythromycin oint. Not helping- switch to ciprodex drops and note given to excuse from work for 48 more hours.

## 2016-07-23 ENCOUNTER — Other Ambulatory Visit: Payer: Self-pay | Admitting: *Deleted

## 2016-07-23 MED ORDER — ERYTHROMYCIN 5 MG/GM OP OINT: 1.0000 "application " | TOPICAL_OINTMENT | Freq: Three times a day (TID) | OPHTHALMIC | 1 refills | Status: DC

## 2016-07-24 ENCOUNTER — Other Ambulatory Visit: Payer: Self-pay | Admitting: Obstetrics and Gynecology

## 2016-07-24 DIAGNOSIS — R8271 Bacteriuria: Secondary | ICD-10-CM | POA: Insufficient documentation

## 2016-07-24 LAB — URINE CULTURE

## 2016-07-24 MED ORDER — AMOXICILLIN 500 MG PO CAPS: 500.0000 mg | ORAL_CAPSULE | Freq: Three times a day (TID) | ORAL | 2 refills | Status: DC

## 2016-08-16 ENCOUNTER — Ambulatory Visit (INDEPENDENT_AMBULATORY_CARE_PROVIDER_SITE_OTHER): Payer: Medicaid Other | Admitting: Obstetrics and Gynecology

## 2016-08-16 VITALS — BP 118/68 | HR 105 | Wt 162.5 lb

## 2016-08-16 DIAGNOSIS — Z3492 Encounter for supervision of normal pregnancy, unspecified, second trimester: Secondary | ICD-10-CM

## 2016-08-16 LAB — POCT URINALYSIS DIPSTICK
Bilirubin, UA: NEGATIVE
Blood, UA: NEGATIVE
Glucose, UA: NEGATIVE
KETONES UA: NEGATIVE
NITRITE UA: NEGATIVE
Spec Grav, UA: 1.015
Urobilinogen, UA: 0.2
pH, UA: 6

## 2016-08-16 NOTE — Progress Notes (Signed)
ROB- dicussed possible anemia- will test at next visit.

## 2016-08-16 NOTE — Progress Notes (Signed)
ROB- pt is concerned about her weight gain, also c/o being very tired

## 2016-09-14 ENCOUNTER — Ambulatory Visit (INDEPENDENT_AMBULATORY_CARE_PROVIDER_SITE_OTHER): Payer: Medicaid Other | Admitting: Obstetrics and Gynecology

## 2016-09-14 ENCOUNTER — Other Ambulatory Visit: Payer: Self-pay | Admitting: *Deleted

## 2016-09-14 ENCOUNTER — Other Ambulatory Visit: Payer: Medicaid Other

## 2016-09-14 VITALS — BP 107/68 | HR 88 | Wt 168.4 lb

## 2016-09-14 DIAGNOSIS — A499 Bacterial infection, unspecified: Secondary | ICD-10-CM

## 2016-09-14 DIAGNOSIS — Z23 Encounter for immunization: Secondary | ICD-10-CM | POA: Diagnosis not present

## 2016-09-14 DIAGNOSIS — N760 Acute vaginitis: Secondary | ICD-10-CM

## 2016-09-14 DIAGNOSIS — B9689 Other specified bacterial agents as the cause of diseases classified elsewhere: Secondary | ICD-10-CM

## 2016-09-14 DIAGNOSIS — Z131 Encounter for screening for diabetes mellitus: Secondary | ICD-10-CM

## 2016-09-14 DIAGNOSIS — Z3492 Encounter for supervision of normal pregnancy, unspecified, second trimester: Secondary | ICD-10-CM | POA: Diagnosis not present

## 2016-09-14 LAB — POCT URINALYSIS DIPSTICK
Bilirubin, UA: NEGATIVE
GLUCOSE UA: NEGATIVE
Ketones, UA: NEGATIVE
LEUKOCYTES UA: NEGATIVE
NITRITE UA: NEGATIVE
RBC UA: NEGATIVE
Spec Grav, UA: 1.01
UROBILINOGEN UA: 0.2
pH, UA: 6.5

## 2016-09-14 MED ORDER — METRONIDAZOLE 500 MG PO TABS: 500.0000 mg | ORAL_TABLET | Freq: Two times a day (BID) | ORAL | 2 refills | Status: DC

## 2016-09-14 MED ORDER — TETANUS-DIPHTH-ACELL PERTUSSIS 5-2.5-18.5 LF-MCG/0.5 IM SUSP
0.5000 mL | Freq: Once | INTRAMUSCULAR | Status: AC
  Administered 2016-09-14: 0.5 mL via INTRAMUSCULAR

## 2016-09-14 NOTE — Patient Instructions (Signed)
Third Trimester of Pregnancy The third trimester is from week 29 through week 42, months 7 through 9. The third trimester is a time when the fetus is growing rapidly. At the end of the ninth month, the fetus is about 20 inches in length and weighs 6-10 pounds.  BODY CHANGES Your body goes through many changes during pregnancy. The changes vary from woman to woman.   Your weight will continue to increase. You can expect to gain 25-35 pounds (11-16 kg) by the end of the pregnancy.  You may begin to get stretch marks on your hips, abdomen, and breasts.  You may urinate more often because the fetus is moving lower into your pelvis and pressing on your bladder.  You may develop or continue to have heartburn as a result of your pregnancy.  You may develop constipation because certain hormones are causing the muscles that push waste through your intestines to slow down.  You may develop hemorrhoids or swollen, bulging veins (varicose veins).  You may have pelvic pain because of the weight gain and pregnancy hormones relaxing your joints between the bones in your pelvis. Backaches may result from overexertion of the muscles supporting your posture.  You may have changes in your hair. These can include thickening of your hair, rapid growth, and changes in texture. Some women also have hair loss during or after pregnancy, or hair that feels dry or thin. Your hair will most likely return to normal after your baby is born.  Your breasts will continue to grow and be tender. A yellow discharge may leak from your breasts called colostrum.  Your belly button may stick out.  You may feel short of breath because of your expanding uterus.  You may notice the fetus "dropping," or moving lower in your abdomen.  You may have a bloody mucus discharge. This usually occurs a few days to a week before labor begins.  Your cervix becomes thin and soft (effaced) near your due date. WHAT TO EXPECT AT YOUR PRENATAL  EXAMS  You will have prenatal exams every 2 weeks until week 36. Then, you will have weekly prenatal exams. During a routine prenatal visit:  You will be weighed to make sure you and the fetus are growing normally.  Your blood pressure is taken.  Your abdomen will be measured to track your baby's growth.  The fetal heartbeat will be listened to.  Any test results from the previous visit will be discussed.  You may have a cervical check near your due date to see if you have effaced. At around 36 weeks, your caregiver will check your cervix. At the same time, your caregiver will also perform a test on the secretions of the vaginal tissue. This test is to determine if a type of bacteria, Group B streptococcus, is present. Your caregiver will explain this further. Your caregiver may ask you:  What your birth plan is.  How you are feeling.  If you are feeling the baby move.  If you have had any abnormal symptoms, such as leaking fluid, bleeding, severe headaches, or abdominal cramping.  If you are using any tobacco products, including cigarettes, chewing tobacco, and electronic cigarettes.  If you have any questions. Other tests or screenings that may be performed during your third trimester include:  Blood tests that check for low iron levels (anemia).  Fetal testing to check the health, activity level, and growth of the fetus. Testing is done if you have certain medical conditions or if   there are problems during the pregnancy.  HIV (human immunodeficiency virus) testing. If you are at high risk, you may be screened for HIV during your third trimester of pregnancy. FALSE LABOR You may feel small, irregular contractions that eventually go away. These are called Braxton Hicks contractions, or false labor. Contractions may last for hours, days, or even weeks before true labor sets in. If contractions come at regular intervals, intensify, or become painful, it is best to be seen by your  caregiver.  SIGNS OF LABOR   Menstrual-like cramps.  Contractions that are 5 minutes apart or less.  Contractions that start on the top of the uterus and spread down to the lower abdomen and back.  A sense of increased pelvic pressure or back pain.  A watery or bloody mucus discharge that comes from the vagina. If you have any of these signs before the 37th week of pregnancy, call your caregiver right away. You need to go to the hospital to get checked immediately. HOME CARE INSTRUCTIONS   Avoid all smoking, herbs, alcohol, and unprescribed drugs. These chemicals affect the formation and growth of the baby.  Do not use any tobacco products, including cigarettes, chewing tobacco, and electronic cigarettes. If you need help quitting, ask your health care provider. You may receive counseling support and other resources to help you quit.  Follow your caregiver's instructions regarding medicine use. There are medicines that are either safe or unsafe to take during pregnancy.  Exercise only as directed by your caregiver. Experiencing uterine cramps is a good sign to stop exercising.  Continue to eat regular, healthy meals.  Wear a good support bra for breast tenderness.  Do not use hot tubs, steam rooms, or saunas.  Wear your seat belt at all times when driving.  Avoid raw meat, uncooked cheese, cat litter boxes, and soil used by cats. These carry germs that can cause birth defects in the baby.  Take your prenatal vitamins.  Take 1500-2000 mg of calcium daily starting at the 20th week of pregnancy until you deliver your baby.  Try taking a stool softener (if your caregiver approves) if you develop constipation. Eat more high-fiber foods, such as fresh vegetables or fruit and whole grains. Drink plenty of fluids to keep your urine clear or pale yellow.  Take warm sitz baths to soothe any pain or discomfort caused by hemorrhoids. Use hemorrhoid cream if your caregiver approves.  If  you develop varicose veins, wear support hose. Elevate your feet for 15 minutes, 3-4 times a day. Limit salt in your diet.  Avoid heavy lifting, wear low heal shoes, and practice good posture.  Rest a lot with your legs elevated if you have leg cramps or low back pain.  Visit your dentist if you have not gone during your pregnancy. Use a soft toothbrush to brush your teeth and be gentle when you floss.  A sexual relationship may be continued unless your caregiver directs you otherwise.  Do not travel far distances unless it is absolutely necessary and only with the approval of your caregiver.  Take prenatal classes to understand, practice, and ask questions about the labor and delivery.  Make a trial run to the hospital.  Pack your hospital bag.  Prepare the baby's nursery.  Continue to go to all your prenatal visits as directed by your caregiver. SEEK MEDICAL CARE IF:  You are unsure if you are in labor or if your water has broken.  You have dizziness.  You have  mild pelvic cramps, pelvic pressure, or nagging pain in your abdominal area.  You have persistent nausea, vomiting, or diarrhea.  You have a bad smelling vaginal discharge.  You have pain with urination. SEEK IMMEDIATE MEDICAL CARE IF:   You have a fever.  You are leaking fluid from your vagina.  You have spotting or bleeding from your vagina.  You have severe abdominal cramping or pain.  You have rapid weight loss or gain.  You have shortness of breath with chest pain.  You notice sudden or extreme swelling of your face, hands, ankles, feet, or legs.  You have not felt your baby move in over an hour.  You have severe headaches that do not go away with medicine.  You have vision changes.   This information is not intended to replace advice given to you by your health care provider. Make sure you discuss any questions you have with your health care provider.   Document Released: 12/07/2001 Document  Revised: 01/03/2015 Document Reviewed: 02/13/2013 Elsevier Interactive Patient Education 2016 Elsevier Inc.      Bacterial Vaginosis Bacterial vaginosis is a vaginal infection that occurs when the normal balance of bacteria in the vagina is disrupted. It results from an overgrowth of certain bacteria. This is the most common vaginal infection in women of childbearing age. Treatment is important to prevent complications, especially in pregnant women, as it can cause a premature delivery. CAUSES  Bacterial vaginosis is caused by an increase in harmful bacteria that are normally present in smaller amounts in the vagina. Several different kinds of bacteria can cause bacterial vaginosis. However, the reason that the condition develops is not fully understood. RISK FACTORS Certain activities or behaviors can put you at an increased risk of developing bacterial vaginosis, including:  Having a new sex partner or multiple sex partners.  Douching.  Using an intrauterine device (IUD) for contraception. Women do not get bacterial vaginosis from toilet seats, bedding, swimming pools, or contact with objects around them. SIGNS AND SYMPTOMS  Some women with bacterial vaginosis have no signs or symptoms. Common symptoms include:  Grey vaginal discharge.  A fishlike odor with discharge, especially after sexual intercourse.  Itching or burning of the vagina and vulva.  Burning or pain with urination. DIAGNOSIS  Your health care provider will take a medical history and examine the vagina for signs of bacterial vaginosis. A sample of vaginal fluid may be taken. Your health care provider will look at this sample under a microscope to check for bacteria and abnormal cells. A vaginal pH test may also be done.  TREATMENT  Bacterial vaginosis may be treated with antibiotic medicines. These may be given in the form of a pill or a vaginal cream. A second round of antibiotics may be prescribed if the condition  comes back after treatment. Because bacterial vaginosis increases your risk for sexually transmitted diseases, getting treated can help reduce your risk for chlamydia, gonorrhea, HIV, and herpes. HOME CARE INSTRUCTIONS   Only take over-the-counter or prescription medicines as directed by your health care provider.  If antibiotic medicine was prescribed, take it as directed. Make sure you finish it even if you start to feel better.  Tell all sexual partners that you have a vaginal infection. They should see their health care provider and be treated if they have problems, such as a mild rash or itching.  During treatment, it is important that you follow these instructions:  Avoid sexual activity or use condoms correctly.  Do  not douche.  Avoid alcohol as directed by your health care provider.  Avoid breastfeeding as directed by your health care provider. SEEK MEDICAL CARE IF:   Your symptoms are not improving after 3 days of treatment.  You have increased discharge or pain.  You have a fever. MAKE SURE YOU:   Understand these instructions.  Will watch your condition.  Will get help right away if you are not doing well or get worse. FOR MORE INFORMATION  Centers for Disease Control and Prevention, Division of STD Prevention: [employer] American Sexual Health Association (ASHA): www.ashastd.org    This information is not intended to replace advice given to you by your health care provider. Make sure you discuss any questions you have with your health care provider.   Document Released: 12/13/2005 Document Revised: 01/03/2015 Document Reviewed: 07/25/2013 Elsevier Interactive Patient Education [employer].

## 2016-09-14 NOTE — Progress Notes (Signed)
ROB- glucola done, Tdap & flu vaccine given; reviewed blood consent form.  Reports vaginal d/c with odor x 1 week.   Microscopic wet-mount exam shows KOH done, clue cells, excessive bacteria.RX sent in for Flagyl 500mg  bid x 5 days

## 2016-09-14 NOTE — Progress Notes (Signed)
ROB- glucola, blood consent signed, tdap & flu given

## 2016-09-15 ENCOUNTER — Other Ambulatory Visit: Payer: Self-pay | Admitting: Obstetrics and Gynecology

## 2016-09-15 DIAGNOSIS — D649 Anemia, unspecified: Secondary | ICD-10-CM | POA: Insufficient documentation

## 2016-09-15 LAB — HEMOGLOBIN AND HEMATOCRIT, BLOOD
HEMATOCRIT: 33.6 % — AB (ref 34.0–46.6)
HEMOGLOBIN: 10.8 g/dL — AB (ref 11.1–15.9)

## 2016-09-15 LAB — GLUCOSE, 1 HOUR GESTATIONAL: GESTATIONAL DIABETES SCREEN: 90 mg/dL (ref 65–139)

## 2016-09-15 MED ORDER — FUSION PLUS PO CAPS: 1.0000 | ORAL_CAPSULE | Freq: Every day | ORAL | 1 refills | Status: DC

## 2016-09-20 ENCOUNTER — Other Ambulatory Visit: Payer: Self-pay | Admitting: Obstetrics & Gynecology

## 2016-09-20 ENCOUNTER — Ambulatory Visit
Admission: RE | Admit: 2016-09-20 | Discharge: 2016-09-20 | Disposition: A | Discharge status: Home / Self Care | Payer: Medicaid Other | Source: Ambulatory Visit | Attending: Obstetrics & Gynecology | Admitting: Obstetrics & Gynecology

## 2016-09-20 DIAGNOSIS — O09899 Supervision of other high risk pregnancies, unspecified trimester: Secondary | ICD-10-CM

## 2016-09-20 DIAGNOSIS — O9989 Other specified diseases and conditions complicating pregnancy, childbirth and the puerperium: Secondary | ICD-10-CM

## 2016-09-20 DIAGNOSIS — O2693 Pregnancy related conditions, unspecified, third trimester: Secondary | ICD-10-CM | POA: Insufficient documentation

## 2016-09-20 DIAGNOSIS — Z2839 Other underimmunization status: Secondary | ICD-10-CM

## 2016-09-20 DIAGNOSIS — O289 Unspecified abnormal findings on antenatal screening of mother: Secondary | ICD-10-CM

## 2016-09-20 DIAGNOSIS — R8271 Bacteriuria: Secondary | ICD-10-CM

## 2016-09-20 DIAGNOSIS — Z3A28 28 weeks gestation of pregnancy: Secondary | ICD-10-CM | POA: Diagnosis not present

## 2016-09-20 DIAGNOSIS — Z283 Underimmunization status: Secondary | ICD-10-CM

## 2016-09-20 HISTORY — DX: Anemia, unspecified: D64.9

## 2016-09-23 DIAGNOSIS — O285 Abnormal chromosomal and genetic finding on antenatal screening of mother: Secondary | ICD-10-CM | POA: Insufficient documentation

## 2016-09-28 ENCOUNTER — Ambulatory Visit (INDEPENDENT_AMBULATORY_CARE_PROVIDER_SITE_OTHER): Payer: Medicaid Other | Admitting: Obstetrics and Gynecology

## 2016-09-28 ENCOUNTER — Other Ambulatory Visit: Payer: Self-pay | Admitting: Obstetrics and Gynecology

## 2016-09-28 VITALS — BP 115/69 | HR 85 | Wt 171.0 lb

## 2016-09-28 DIAGNOSIS — Z3493 Encounter for supervision of normal pregnancy, unspecified, third trimester: Secondary | ICD-10-CM

## 2016-09-28 LAB — POCT URINALYSIS DIPSTICK
Blood, UA: NEGATIVE
GLUCOSE UA: NEGATIVE
KETONES UA: NEGATIVE
Leukocytes, UA: NEGATIVE
Nitrite, UA: NEGATIVE
SPEC GRAV UA: 1.015
Urobilinogen, UA: 0.2
pH, UA: 6.5

## 2016-09-28 NOTE — Progress Notes (Signed)
Rob-doing well. [facility].plans epidural.

## 2016-09-28 NOTE — Progress Notes (Signed)
ROB- pt is doing well, hasnt started fusion plus-samples provided

## 2016-09-30 LAB — URINE CULTURE

## 2016-10-13 ENCOUNTER — Other Ambulatory Visit: Payer: Self-pay | Admitting: Obstetrics and Gynecology

## 2016-10-13 ENCOUNTER — Ambulatory Visit (INDEPENDENT_AMBULATORY_CARE_PROVIDER_SITE_OTHER): Payer: Medicaid Other | Admitting: Obstetrics and Gynecology

## 2016-10-13 VITALS — BP 121/76 | HR 90 | Wt 174.7 lb

## 2016-10-13 DIAGNOSIS — R12 Heartburn: Secondary | ICD-10-CM

## 2016-10-13 DIAGNOSIS — Z3493 Encounter for supervision of normal pregnancy, unspecified, third trimester: Secondary | ICD-10-CM

## 2016-10-13 DIAGNOSIS — O26899 Other specified pregnancy related conditions, unspecified trimester: Secondary | ICD-10-CM

## 2016-10-13 LAB — POCT URINALYSIS DIPSTICK
Bilirubin, UA: NEGATIVE
Glucose, UA: NEGATIVE
NITRITE UA: NEGATIVE
PH UA: 6.5
RBC UA: NEGATIVE
Spec Grav, UA: 1.01
UROBILINOGEN UA: 0.2

## 2016-10-13 MED ORDER — RANITIDINE HCL 150 MG PO TABS: 150.0000 mg | ORAL_TABLET | Freq: Two times a day (BID) | ORAL | 2 refills | Status: DC

## 2016-10-13 MED ORDER — FLUCONAZOLE 150 MG PO TABS: 150.0000 mg | ORAL_TABLET | Freq: Once | ORAL | 3 refills | Status: DC

## 2016-10-13 NOTE — Progress Notes (Signed)
ROB- pt is having a lot of pelvic pressure, some pain in lower abdomen, otherwise she is doing well

## 2016-10-13 NOTE — Progress Notes (Signed)
ROB-[facility] and [facility], has follow-up on Nov.11, has yeast infection from Flagyl- sent in prescription for Diflucan, heartburn nightly- added zantac.

## 2016-10-15 LAB — URINE CULTURE: Organism ID, Bacteria: NO GROWTH

## 2016-10-26 ENCOUNTER — Ambulatory Visit (INDEPENDENT_AMBULATORY_CARE_PROVIDER_SITE_OTHER): Payer: Medicaid Other | Admitting: Obstetrics and Gynecology

## 2016-10-26 VITALS — BP 104/77 | HR 100 | Wt 172.8 lb

## 2016-10-26 DIAGNOSIS — Z3493 Encounter for supervision of normal pregnancy, unspecified, third trimester: Secondary | ICD-10-CM

## 2016-10-26 LAB — POCT URINALYSIS DIPSTICK
BILIRUBIN UA: NEGATIVE
GLUCOSE UA: NEGATIVE
KETONES UA: NEGATIVE
NITRITE UA: NEGATIVE
PH UA: 6.5
RBC UA: NEGATIVE
SPEC GRAV UA: 1.015
Urobilinogen, UA: 0.2

## 2016-10-26 NOTE — Progress Notes (Signed)
ROB-doing well except mild back pain, reports continued d/c but itching resolved with diflucan, Microscopic wet-mount exam shows negative for pathogens, normal epithelial cells, lactobacilli. To take another dose of diflucan. Plans depo PP

## 2016-10-26 NOTE — Progress Notes (Signed)
ROB- pt is c/o low back pain, some pelvic pressure,

## 2016-10-27 ENCOUNTER — Other Ambulatory Visit: Payer: Self-pay | Admitting: Obstetrics and Gynecology

## 2016-11-09 ENCOUNTER — Ambulatory Visit (INDEPENDENT_AMBULATORY_CARE_PROVIDER_SITE_OTHER): Payer: Medicaid Other | Admitting: Obstetrics and Gynecology

## 2016-11-09 VITALS — BP 110/72 | HR 92 | Wt 177.4 lb

## 2016-11-09 DIAGNOSIS — Z113 Encounter for screening for infections with a predominantly sexual mode of transmission: Secondary | ICD-10-CM

## 2016-11-09 DIAGNOSIS — Z3493 Encounter for supervision of normal pregnancy, unspecified, third trimester: Secondary | ICD-10-CM

## 2016-11-09 DIAGNOSIS — Z3685 Encounter for antenatal screening for Streptococcus B: Secondary | ICD-10-CM

## 2016-11-09 LAB — POCT URINALYSIS DIPSTICK
Bilirubin, UA: NEGATIVE
Glucose, UA: NEGATIVE
Ketones, UA: 5
Leukocytes, UA: NEGATIVE
NITRITE UA: NEGATIVE
PH UA: 7
RBC UA: NEGATIVE
SPEC GRAV UA: 1.015
UROBILINOGEN UA: 0.2

## 2016-11-09 NOTE — Progress Notes (Signed)
ROB- cultures obtained, pt is having some pelvic pressure 

## 2016-11-09 NOTE — Progress Notes (Signed)
ROB- cultures obtained, will plan IOL after 39 weeks. Scheduled for 12/02/16

## 2016-11-11 LAB — GC/CHLAMYDIA PROBE AMP
CHLAMYDIA, DNA PROBE: NEGATIVE
NEISSERIA GONORRHOEAE BY PCR: NEGATIVE

## 2016-11-11 LAB — STREP GP B NAA: STREP GROUP B AG: POSITIVE — AB

## 2016-11-16 ENCOUNTER — Ambulatory Visit (INDEPENDENT_AMBULATORY_CARE_PROVIDER_SITE_OTHER): Payer: Medicaid Other | Admitting: Obstetrics and Gynecology

## 2016-11-16 VITALS — BP 123/67 | HR 84 | Wt 181.2 lb

## 2016-11-16 DIAGNOSIS — Z3493 Encounter for supervision of normal pregnancy, unspecified, third trimester: Secondary | ICD-10-CM

## 2016-11-16 LAB — POCT URINALYSIS DIPSTICK
BILIRUBIN UA: NEGATIVE
Blood, UA: NEGATIVE
GLUCOSE UA: NEGATIVE
KETONES UA: NEGATIVE
LEUKOCYTES UA: NEGATIVE
Nitrite, UA: NEGATIVE
Protein, UA: NEGATIVE
Spec Grav, UA: <=1.005
Urobilinogen, UA: 0.2
pH, UA: 6

## 2016-11-16 NOTE — Progress Notes (Signed)
ROB- Overall doing well. Reports discharge still present but has not picked up her Diflucan yet. Will follow up in 1 week for ROB.

## 2016-11-16 NOTE — Progress Notes (Signed)
I assessed the patient with the student and agree with plan of care. Melody TomballShambely, CNM

## 2016-11-16 NOTE — Progress Notes (Signed)
ROB- pt is having a lot of pelvic pressure, low back pain, still having d/c

## 2016-11-23 ENCOUNTER — Ambulatory Visit (INDEPENDENT_AMBULATORY_CARE_PROVIDER_SITE_OTHER): Payer: Medicaid Other | Admitting: Obstetrics and Gynecology

## 2016-11-23 VITALS — BP 100/63 | HR 101 | Wt 178.6 lb

## 2016-11-23 DIAGNOSIS — K649 Unspecified hemorrhoids: Secondary | ICD-10-CM

## 2016-11-23 DIAGNOSIS — Z3483 Encounter for supervision of other normal pregnancy, third trimester: Secondary | ICD-10-CM

## 2016-11-23 LAB — POCT URINALYSIS DIPSTICK
Bilirubin, UA: NEGATIVE
GLUCOSE UA: NEGATIVE
KETONES UA: NEGATIVE
Leukocytes, UA: NEGATIVE
Nitrite, UA: NEGATIVE
PROTEIN UA: NEGATIVE
RBC UA: NEGATIVE
UROBILINOGEN UA: NEGATIVE
pH, UA: 7

## 2016-11-23 NOTE — Progress Notes (Signed)
ROB: Patient doing well, does c/o hemorrhoids.  Recommended OTC topical hemorrhoid cream.  Discussed GBS status again with pt, all questions answered. RTC in 1 week. Discussed labor precautions.

## 2016-11-30 ENCOUNTER — Ambulatory Visit (INDEPENDENT_AMBULATORY_CARE_PROVIDER_SITE_OTHER): Payer: Medicaid Other | Admitting: Obstetrics and Gynecology

## 2016-11-30 ENCOUNTER — Encounter: Payer: Medicaid Other | Admitting: Obstetrics and Gynecology

## 2016-11-30 ENCOUNTER — Inpatient Hospital Stay
Admission: EM | Admit: 2016-11-30 | Discharge: 2016-12-03 | DRG: 775 | Disposition: A | Discharge status: Home / Self Care | Payer: Medicaid Other | Attending: Obstetrics and Gynecology | Admitting: Obstetrics and Gynecology

## 2016-11-30 ENCOUNTER — Inpatient Hospital Stay
Admission: EM | Admit: 2016-11-30 | Discharge: 2016-11-30 | Disposition: A | Discharge status: Home / Self Care | Payer: Medicaid Other | Source: Home / Self Care | Admitting: Obstetrics and Gynecology

## 2016-11-30 VITALS — BP 117/66 | HR 88 | Wt 179.2 lb

## 2016-11-30 DIAGNOSIS — Z283 Underimmunization status: Secondary | ICD-10-CM

## 2016-11-30 DIAGNOSIS — Z1389 Encounter for screening for other disorder: Secondary | ICD-10-CM

## 2016-11-30 DIAGNOSIS — Z3493 Encounter for supervision of normal pregnancy, unspecified, third trimester: Secondary | ICD-10-CM

## 2016-11-30 DIAGNOSIS — O289 Unspecified abnormal findings on antenatal screening of mother: Secondary | ICD-10-CM

## 2016-11-30 DIAGNOSIS — Z23 Encounter for immunization: Secondary | ICD-10-CM

## 2016-11-30 DIAGNOSIS — Z3A39 39 weeks gestation of pregnancy: Secondary | ICD-10-CM

## 2016-11-30 DIAGNOSIS — O09899 Supervision of other high risk pregnancies, unspecified trimester: Secondary | ICD-10-CM

## 2016-11-30 DIAGNOSIS — O9989 Other specified diseases and conditions complicating pregnancy, childbirth and the puerperium: Secondary | ICD-10-CM

## 2016-11-30 DIAGNOSIS — R8271 Bacteriuria: Secondary | ICD-10-CM

## 2016-11-30 DIAGNOSIS — Z2839 Other underimmunization status: Secondary | ICD-10-CM

## 2016-11-30 DIAGNOSIS — Z3A38 38 weeks gestation of pregnancy: Secondary | ICD-10-CM | POA: Insufficient documentation

## 2016-11-30 DIAGNOSIS — Z833 Family history of diabetes mellitus: Secondary | ICD-10-CM

## 2016-11-30 DIAGNOSIS — O99824 Streptococcus B carrier state complicating childbirth: Principal | ICD-10-CM | POA: Diagnosis present

## 2016-11-30 DIAGNOSIS — Z369 Encounter for antenatal screening, unspecified: Secondary | ICD-10-CM

## 2016-11-30 LAB — POCT URINALYSIS DIPSTICK
BILIRUBIN UA: NEGATIVE
Glucose, UA: NEGATIVE
KETONES UA: NEGATIVE
NITRITE UA: NEGATIVE
PH UA: 6.5
PROTEIN UA: NEGATIVE
Spec Grav, UA: 1.02
Urobilinogen, UA: NEGATIVE

## 2016-11-30 NOTE — OB Triage Provider Note (Signed)
L&D OB Triage Note  Natalie Scott is a 25 y.o. G2P1001 female at 6032w6d, EDD Estimated Date of Delivery: 12/08/16 who presented to triage for complaints of irregular contractions since office visit today.  She was evaluated by the nurses with no significant findings/findings significant for active labor. Vital signs stable. An NST was performed and has been reviewed by me. Cervix without change since office visit, 3-4/80/-2  NST INTERPRETATION: Indications: rule out uterine contractions  Mode: External Baseline Rate (A): 150 bpm Variability: Moderate Accelerations: 15 x 15 Decelerations: None     Contraction Frequency (min): none noted   Impression: reactive   Plan: NST performed was reviewed and was found to be reactive. She was discharged home with bleeding/labor precautions.  Continue routine prenatal care. Follow up with OB/GYN as previously scheduled.     Melody Suzan NailerN Shambley, CNM

## 2016-11-30 NOTE — Progress Notes (Signed)
ROB- discussed labor precautions, will lan IOL on 12/06/16 if not delivery

## 2016-11-30 NOTE — Progress Notes (Signed)
Contractions during night and am. Every 10-577min. Then stopped.

## 2016-11-30 NOTE — Progress Notes (Signed)
ROB- Had contractions last night from 3AM-7AM about every 7-10 minutes lasting approximately 30 seconds each.  Is having some blood tinged mucous. Increased pelvic pressure. Denies any other complaints.

## 2016-11-30 NOTE — OB Triage Note (Signed)
Pt G2P1 4583w6d complains of contractions every 7-10 min apart. Pt states she has been bleeding. Was seen at office at 1100 this morning and said she was 4 cm per CNM. Pt denies gush of fluid. Patient states + FM. Pt vitals WDL. Pain 6/10 during contractions.

## 2016-12-01 DIAGNOSIS — Z23 Encounter for immunization: Secondary | ICD-10-CM | POA: Diagnosis not present

## 2016-12-01 DIAGNOSIS — Z3A39 39 weeks gestation of pregnancy: Secondary | ICD-10-CM | POA: Diagnosis not present

## 2016-12-01 DIAGNOSIS — Z833 Family history of diabetes mellitus: Secondary | ICD-10-CM | POA: Diagnosis not present

## 2016-12-01 DIAGNOSIS — Z3480 Encounter for supervision of other normal pregnancy, unspecified trimester: Secondary | ICD-10-CM | POA: Diagnosis not present

## 2016-12-01 DIAGNOSIS — Z3493 Encounter for supervision of normal pregnancy, unspecified, third trimester: Secondary | ICD-10-CM | POA: Diagnosis present

## 2016-12-01 DIAGNOSIS — O99824 Streptococcus B carrier state complicating childbirth: Secondary | ICD-10-CM | POA: Diagnosis present

## 2016-12-01 LAB — CBC
HEMATOCRIT: 33.3 % — AB (ref 35.0–47.0)
HEMATOCRIT: 34.9 % — AB (ref 35.0–47.0)
HEMOGLOBIN: 11 g/dL — AB (ref 12.0–16.0)
HEMOGLOBIN: 11.7 g/dL — AB (ref 12.0–16.0)
MCH: 27.5 pg (ref 26.0–34.0)
MCH: 28 pg (ref 26.0–34.0)
MCHC: 33.1 g/dL (ref 32.0–36.0)
MCHC: 33.5 g/dL (ref 32.0–36.0)
MCV: 83.1 fL (ref 80.0–100.0)
MCV: 83.7 fL (ref 80.0–100.0)
Platelets: 181 10*3/uL (ref 150–440)
Platelets: 187 10*3/uL (ref 150–440)
RBC: 4.01 MIL/uL (ref 3.80–5.20)
RBC: 4.17 MIL/uL (ref 3.80–5.20)
RDW: 15.5 % — ABNORMAL HIGH (ref 11.5–14.5)
RDW: 15.6 % — AB (ref 11.5–14.5)
WBC: 11.1 10*3/uL — AB (ref 3.6–11.0)
WBC: 14.6 10*3/uL — ABNORMAL HIGH (ref 3.6–11.0)

## 2016-12-01 LAB — TYPE AND SCREEN
ABO/RH(D): A POS
ANTIBODY SCREEN: NEGATIVE

## 2016-12-01 LAB — URINE DRUG SCREEN, QUALITATIVE (ARMC ONLY)
Amphetamines, Ur Screen: NOT DETECTED
BARBITURATES, UR SCREEN: NOT DETECTED
BENZODIAZEPINE, UR SCRN: NOT DETECTED
CANNABINOID 50 NG, UR ~~LOC~~: NOT DETECTED
COCAINE METABOLITE, UR ~~LOC~~: NOT DETECTED
MDMA (Ecstasy)Ur Screen: NOT DETECTED
METHADONE SCREEN, URINE: NOT DETECTED
OPIATE, UR SCREEN: NOT DETECTED
PHENCYCLIDINE (PCP) UR S: NOT DETECTED
Tricyclic, Ur Screen: NOT DETECTED

## 2016-12-01 MED ORDER — BENZOCAINE-MENTHOL 20-0.5 % EX AERO: 1.0000 "application " | INHALATION_SPRAY | CUTANEOUS | Status: DC | PRN

## 2016-12-01 MED ORDER — SOD CITRATE-CITRIC ACID 500-334 MG/5ML PO SOLN: 30.0000 mL | ORAL | Status: DC | PRN

## 2016-12-01 MED ORDER — ACETAMINOPHEN 325 MG PO TABS: 650.0000 mg | ORAL_TABLET | ORAL | Status: DC | PRN

## 2016-12-01 MED ORDER — ONDANSETRON HCL 4 MG/2ML IJ SOLN
4.0000 mg | Freq: Four times a day (QID) | INTRAMUSCULAR | Status: DC | PRN
Start: 2016-12-01 — End: 2016-12-01

## 2016-12-01 MED ORDER — LIDOCAINE HCL (PF) 1 % IJ SOLN
INTRAMUSCULAR | Status: AC
  Filled 2016-12-01: qty 30

## 2016-12-01 MED ORDER — ONDANSETRON HCL 4 MG PO TABS: 4.0000 mg | ORAL_TABLET | ORAL | Status: DC | PRN

## 2016-12-01 MED ORDER — SIMETHICONE 80 MG PO CHEW: 80.0000 mg | CHEWABLE_TABLET | ORAL | Status: DC | PRN

## 2016-12-01 MED ORDER — SENNOSIDES-DOCUSATE SODIUM 8.6-50 MG PO TABS
2.0000 | ORAL_TABLET | ORAL | Status: DC
Start: 2016-12-02 — End: 2016-12-03
  Administered 2016-12-02 – 2016-12-03 (×2): 2 via ORAL
  Filled 2016-12-01 (×2): qty 2

## 2016-12-01 MED ORDER — AMMONIA AROMATIC IN INHA
RESPIRATORY_TRACT | Status: AC
  Filled 2016-12-01: qty 10

## 2016-12-01 MED ORDER — IBUPROFEN 600 MG PO TABS
600.0000 mg | ORAL_TABLET | Freq: Four times a day (QID) | ORAL | Status: DC
  Administered 2016-12-02 – 2016-12-03 (×6): 600 mg via ORAL
  Filled 2016-12-01 (×6): qty 1

## 2016-12-01 MED ORDER — OXYCODONE HCL 5 MG PO TABS
10.0000 mg | ORAL_TABLET | ORAL | Status: DC | PRN
  Administered 2016-12-01 – 2016-12-03 (×8): 10 mg via ORAL
  Filled 2016-12-01 (×7): qty 2

## 2016-12-01 MED ORDER — ONDANSETRON HCL 4 MG/2ML IJ SOLN: 4.0000 mg | INTRAMUSCULAR | Status: DC | PRN

## 2016-12-01 MED ORDER — PRENATAL MULTIVITAMIN CH
1.0000 | ORAL_TABLET | Freq: Every day | ORAL | Status: DC
  Administered 2016-12-01 – 2016-12-03 (×3): 1 via ORAL
  Filled 2016-12-01 (×3): qty 1

## 2016-12-01 MED ORDER — COCONUT OIL OIL
1.0000 "application " | TOPICAL_OIL | Status: DC | PRN
  Administered 2016-12-02: 1 via TOPICAL
  Filled 2016-12-01: qty 120

## 2016-12-01 MED ORDER — LIDOCAINE HCL (PF) 1 % IJ SOLN: 30.0000 mL | INTRAMUSCULAR | Status: DC | PRN

## 2016-12-01 MED ORDER — ZOLPIDEM TARTRATE 5 MG PO TABS: 5.0000 mg | ORAL_TABLET | Freq: Every evening | ORAL | Status: DC | PRN

## 2016-12-01 MED ORDER — OXYTOCIN BOLUS FROM INFUSION
500.0000 mL | Freq: Once | INTRAVENOUS | Status: AC
  Administered 2016-12-01: 500 mL via INTRAVENOUS

## 2016-12-01 MED ORDER — MISOPROSTOL 200 MCG PO TABS
ORAL_TABLET | ORAL | Status: AC
  Filled 2016-12-01: qty 4

## 2016-12-01 MED ORDER — OXYTOCIN 40 UNITS IN LACTATED RINGERS INFUSION - SIMPLE MED
2.5000 [IU]/h | INTRAVENOUS | Status: DC
  Filled 2016-12-01: qty 1000

## 2016-12-01 MED ORDER — OXYTOCIN 10 UNIT/ML IJ SOLN
INTRAMUSCULAR | Status: AC
  Filled 2016-12-01: qty 2

## 2016-12-01 MED ORDER — DIBUCAINE 1 % RE OINT: 1.0000 "application " | TOPICAL_OINTMENT | RECTAL | Status: DC | PRN

## 2016-12-01 MED ORDER — IBUPROFEN 600 MG PO TABS
ORAL_TABLET | ORAL | Status: AC
  Administered 2016-12-01: 600 mg via ORAL
  Filled 2016-12-01: qty 1

## 2016-12-01 MED ORDER — SODIUM CHLORIDE 0.9 % IV SOLN: 1.0000 g | INTRAVENOUS | Status: DC

## 2016-12-01 MED ORDER — DIPHENHYDRAMINE HCL 25 MG PO CAPS: 25.0000 mg | ORAL_CAPSULE | Freq: Four times a day (QID) | ORAL | Status: DC | PRN

## 2016-12-01 MED ORDER — VARICELLA VIRUS VACCINE LIVE 1350 PFU/0.5ML IJ SUSR
0.5000 mL | Freq: Once | INTRAMUSCULAR | Status: AC
  Administered 2016-12-03: 0.5 mL via SUBCUTANEOUS
  Filled 2016-12-01: qty 0.5

## 2016-12-01 MED ORDER — FENTANYL CITRATE (PF) 100 MCG/2ML IJ SOLN
INTRAMUSCULAR | Status: AC
  Administered 2016-12-01: 100 ug via INTRAVENOUS
  Filled 2016-12-01: qty 2

## 2016-12-01 MED ORDER — WITCH HAZEL-GLYCERIN EX PADS: 1.0000 "application " | MEDICATED_PAD | CUTANEOUS | Status: DC | PRN

## 2016-12-01 MED ORDER — FENTANYL CITRATE (PF) 100 MCG/2ML IJ SOLN
50.0000 ug | INTRAMUSCULAR | Status: DC | PRN
  Administered 2016-12-01: 100 ug via INTRAVENOUS

## 2016-12-01 MED ORDER — LACTATED RINGERS IV SOLN
INTRAVENOUS | Status: DC
  Administered 2016-12-01: via INTRAVENOUS

## 2016-12-01 MED ORDER — IBUPROFEN 600 MG PO TABS
600.0000 mg | ORAL_TABLET | Freq: Four times a day (QID) | ORAL | Status: DC
  Administered 2016-12-01 (×4): 600 mg via ORAL
  Filled 2016-12-01 (×4): qty 1

## 2016-12-01 MED ORDER — LACTATED RINGERS IV SOLN: 500.0000 mL | INTRAVENOUS | Status: DC | PRN

## 2016-12-01 MED ORDER — MEASLES, MUMPS & RUBELLA VAC ~~LOC~~ INJ
0.5000 mL | INJECTION | Freq: Once | SUBCUTANEOUS | Status: AC
  Administered 2016-12-03: 0.5 mL via SUBCUTANEOUS
  Filled 2016-12-01: qty 0.5

## 2016-12-01 MED ORDER — OXYCODONE HCL 5 MG PO TABS
5.0000 mg | ORAL_TABLET | ORAL | Status: DC | PRN
  Filled 2016-12-01 (×2): qty 1

## 2016-12-01 NOTE — Progress Notes (Signed)
Delivery Note  First Stage: Labor onset: 11/30/16 @2300   Augmentation : none Analgesia /Anesthesia intrapartum: none SROM at 0051 - clear fluid   Second Stage:  I was called to attend precipitous delivery for [facility] with Melody Shambley in route. She rapidly progressed from 5cm to 10cm and felt the urge the push.  She pushed 3 times and spontaneously ruptured for clear fluid and delivered the fetal head atraumatically over an intact perineum in Direct OA position.  The anterior shoulder quickly delivered followed by the posterior shoulder and body.  A vigorous female was delivered at 0051, and placed on mom's abdomen and dried.  No nuchal cord.   Cord double clamped after cessation of pulsation, cut by patient's mother.  Cord blood sample N/A  Birth Weight: 6#14oz  Melody Shambley at bedside for third stage.    Carlean JewsMeredith Morty Ortwein, CNM [facility]  [phone]

## 2016-12-01 NOTE — H&P (Signed)
Obstetric History and Physical  Natalie Scott is a 25 y.o. G2P1001 with IUP at 4946w0d presenting with regular contractions. Patient states she has been having  regular, every 1-3 minutes contractions, minimal vaginal bleeding, intact membranes, with active fetal movement.    [facility]  Pregnancy complications or risks:GBS+, increased risk downs on genetic screening  Prenatal labs and studies: ABO, Rh: --/--/PENDING (12/06 0017) Antibody: PENDING (12/06 0017) Rubella: <20.0 (05/02 1034) RPR: Non Reactive (05/02 1034)  HBsAg: Negative (05/02 1034)  HIV: Non Reactive (05/02 1034)  ZOX:WRUEAVWUGBS:Positive (11/14 1422) 1 hr Glucola  normal Genetic screening abnormal with increased risk Downs Anatomy US normal  Past Medical History:  Diagnosis Date  . Anemia   . Eczema   . Headache   . History of anemia   . Hx: UTI (urinary tract infection)     History reviewed. No pertinent surgical history.  OB History  Gravida Para Term Preterm AB Living  2 1 1  0 0 1  SAB TAB Ectopic Multiple Live Births  0 0 0 0 1    # Outcome Date GA Lbr Len/2nd Weight Sex Delivery Anes PTL Lv  2 Current           1 Term 01/09/11 6553w0d  8 lb (3.629 kg) F Vag-Spont  N LIV      Social History   Social History  . Marital status: Single    Spouse name: N/A  . Number of children: N/A  . Years of education: N/A   Social History Main Topics  . Smoking status: Never Smoker  . Smokeless tobacco: Never Used  . Alcohol use No  . Drug use:     Types: Marijuana     Comment: not since pregnant  . Sexual activity: Yes    Partners: Male   Other Topics Concern  . None   Social History Narrative  . None    Family History  Problem Relation Age of Onset  . Osteoarthritis Paternal Grandmother   . Asthma Daughter   . Asthma Brother   . Breast cancer Maternal Aunt 50  . Diabetes      maternal side    Facility-Administered Medications Prior to Admission  Medication Dose Route  Frequency Provider Last Rate Last Dose  . ciprofloxacin-fluocinolone otic solution 0.3%-0.025%  0.25 mL Otic BID Corey Laski Suzan NailerN Cristina Ceniceros, CNM       Prescriptions Prior to Admission  Medication Sig Dispense Refill Last Dose  . Prenat w/o A Vit-FeFum-FePo-FA (CONCEPT OB) 130-92.4-1 MG CAPS Take 1 capsule by mouth daily. 30 capsule 11 11/30/2016 at Unknown time  . ranitidine (ZANTAC) 150 MG tablet Take 1 tablet (150 mg total) by mouth 2 (two) times daily. 60 tablet 2 Past Week at Unknown time  . Iron-FA-B Cmp-C-Biot-Probiotic (FUSION PLUS) CAPS Take 1 capsule by mouth daily. (Patient not taking: Reported on 11/30/2016) 60 capsule 1 Not Taking at Unknown time  . metroNIDAZOLE (FLAGYL) 500 MG tablet Take 1 tablet (500 mg total) by mouth 2 (two) times daily. (Patient not taking: Reported on 11/23/2016) 10 tablet 2 Not Taking    No Known Allergies  Review of Systems: Negative except for what is mentioned in HPI.  Physical Exam: BP 122/75 (BP Location: Left Arm)   Temp 97.7 F (36.5 C) (Oral)   Resp 19   Ht 5\' 6"  (1.676 m)   Wt 179 lb (81.2 kg)   LMP 03/03/2016   BMI 28.89 kg/m  GENERAL: Well-developed, well-nourished female in  no acute distress.  LUNGS: Clear to auscultation bilaterally.  HEART: Regular rate and rhythm. ABDOMEN: Soft, nontender, nondistended, gravid. EXTREMITIES: Nontender, no edema, 2+ distal pulses. Cervical Exam: Dilation: 10 Effacement (%): 80 Station: +3 Presentation: Vertex Exam by:: R Speilman RN FHT:  Baseline rate 150 bpm   Variability moderate  Accelerations present   Decelerations none Contractions: Every 1-3 mins on admission   Pertinent Labs/Studies:   Results for orders placed or performed during the hospital encounter of 11/30/16 (from the past 24 hour(s))  CBC     Status: Abnormal   Collection Time: 12/01/16 12:17 AM  Result Value Ref Range   WBC 11.1 (H) 3.6 - 11.0 K/uL   RBC 4.17 3.80 - 5.20 MIL/uL   Hemoglobin 11.7 (L) 12.0 - 16.0 g/dL   HCT 57.834.9 (L)  46.935.0 - 47.0 %   MCV 83.7 80.0 - 100.0 fL   MCH 28.0 26.0 - 34.0 pg   MCHC 33.5 32.0 - 36.0 g/dL   RDW 62.915.5 (H) 52.811.5 - 41.314.5 %   Platelets 187 150 - 440 K/uL  Type and screen     Status: None (Preliminary result)   Collection Time: 12/01/16 12:17 AM  Result Value Ref Range   ABO/RH(D) PENDING    Antibody Screen PENDING    Sample Expiration 12/04/2016     Assessment : Natalie Scott is a [age] y.o. G2P1001 at 7362w0d being admitted for labor.  Plan: Labor: Expectant management.  Induction/Augmentation as needed, per protocol FWB: Reassuring fetal heart tracing.  GBS positive Delivery plan: Hopeful for vaginal delivery  Jaikob Borgwardt, CNM [facility]

## 2016-12-01 NOTE — Progress Notes (Signed)
12/01/16 To whom it may concern:                                               is given a note for excuse from work for the next 24 hours.  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 She will be needed for transportation and assistance during immediate post-delivery care.  Please feel free to contact our office for verification if needed.         Chavela Justiniano ClydeShambley, [place] [facility] [phone]

## 2016-12-02 LAB — RPR: RPR Ser Ql: NONREACTIVE

## 2016-12-02 NOTE — Progress Notes (Signed)
Post Partum Day 1 Subjective: no complaints, up ad lib and voiding  Objective: Blood pressure 107/69, pulse 74, temperature 98.1 F (36.7 C), temperature source Oral, resp. rate 18, height 5\' 6"  (1.676 m), weight 179 lb (81.2 kg), last menstrual period 03/03/2016, SpO2 100 %.  Physical Exam:  General: alert, cooperative and appears stated age Lochia: appropriate Uterine Fundus: firm Incision: NA DVT Evaluation: No evidence of DVT seen on physical exam. Negative Homan's sign.   Recent Labs  12/01/16 0017 12/01/16 0413  HGB 11.7* 11.0*  HCT 34.9* 33.3*    Assessment/Plan: Plan for discharge tomorrow and Breastfeeding Infant feeding only Breast   LOS: 1 day   Melody N Shambley 12/02/2016, 8:36 AM

## 2016-12-03 ENCOUNTER — Encounter: Payer: Self-pay | Admitting: Emergency Medicine

## 2016-12-03 MED ORDER — MEDROXYPROGESTERONE ACETATE 150 MG/ML IM SUSP: 150.0000 mg | INTRAMUSCULAR | 4 refills | Status: DC

## 2016-12-03 MED ORDER — VITAMIN D3 125 MCG (5000 UT) PO CAPS: 1.0000 | ORAL_CAPSULE | Freq: Every day | ORAL | 2 refills | Status: DC

## 2016-12-03 NOTE — Progress Notes (Signed)
Patient understands all discharge instructions and the need to make follow up appointments. Patient discharge via wheelchair with auxillary. 

## 2016-12-03 NOTE — Discharge Summary (Signed)
OB Discharge Summary     Patient Name: Natalie Scott DOB: Oct 21, 1991 MRN: [id number]  Date of admission: 11/30/2016 Delivering MD: Lars Pinks C   Date of discharge: 12/03/2016  Admitting diagnosis: Contractions  Intrauterine pregnancy: [redacted]w[redacted]d    Secondary diagnosis:  Active Problems:   Labor and delivery, indication for care  Additional problems: none     Discharge diagnosis: Term Pregnancy Delivered                                                                                                Post partum procedures:MMR and Varicella vaccines  Augmentation: none  Complications: None  Hospital course:  Onset of Labor With Vaginal Delivery     25y.o. yo G2P1001 at 350w2das admitted in Active Labor on 11/30/2016. Patient had an uncomplicated labor course as follows:  Membrane Rupture Time/Date: 12:51 AM ,12/01/2016   Intrapartum Procedures: Episiotomy: None [1]                                         Lacerations:  None [1]  Patient had a delivery of a Viable infant. 12/01/2016  Information for the patient's newborn:  JoJordann, Grime0[[id number]]Delivery Method: Vag-Spont    Pateint had an uncomplicated postpartum course.  She is ambulating, tolerating a regular diet, passing flatus, and urinating well. Patient is discharged home in stable condition on 12/03/16.    Physical exam Vitals:   12/02/16 1118 12/02/16 1613 12/02/16 1955 12/03/16 0751  BP:   115/63 116/75  Pulse:   73 77  Resp:   19 20  Temp: 97.8 F (36.6 C) 98.2 F (36.8 C) 98.4 F (36.9 C) 98.9 F (37.2 C)  TempSrc:   Oral Oral  SpO2:   99% 100%  Weight:      Height:       General: alert, cooperative and no distress Lochia: appropriate Uterine Fundus: firm Incision: N/A DVT Evaluation: No evidence of DVT seen on physical exam. Negative Homan's sign. Labs: Lab Results  Component Value Date   WBC 14.6 (H) 12/01/2016   HGB 11.0 (L) 12/01/2016   HCT 33.3 (L) 12/01/2016   MCV 83.1  12/01/2016   PLT 181 12/01/2016   CMP Latest Ref Rng & Units 04/13/2016  Glucose 65 - 99 mg/dL 108(H)  BUN 6 - 20 mg/dL 10  Creatinine 0.44 - 1.00 mg/dL 0.60  Sodium 135 - 145 mmol/L 135  Potassium 3.5 - 5.1 mmol/L 3.3(L)  Chloride 101 - 111 mmol/L 103  CO2 22 - 32 mmol/L 24  Calcium 8.9 - 10.3 mg/dL 9.4  Total Protein 6.5 - 8.1 g/dL 7.5  Total Bilirubin 0.3 - 1.2 mg/dL 0.8  Alkaline Phos 38 - 126 U/L 41  AST 15 - 41 U/L 25  ALT 14 - 54 U/L 18    Discharge instruction: per After Visit Summary and "Baby and Me Booklet".  After visit meds:    Medication List    STOP taking these medications  metroNIDAZOLE 500 MG tablet Commonly known as:  FLAGYL   ranitidine 150 MG tablet Commonly known as:  ZANTAC     TAKE these medications   CONCEPT OB 130-92.4-1 MG Caps Take 1 capsule by mouth daily.   FUSION PLUS Caps Take 1 capsule by mouth daily.   medroxyPROGESTERone 150 MG/ML injection Commonly known as:  DEPO-PROVERA Inject 1 mL (150 mg total) into the muscle every 3 (three) months.   Vitamin D3 5000 units Caps Take 1 capsule (5,000 Units total) by mouth daily.       Diet: routine diet  Activity: Advance as tolerated. Pelvic rest for 6 weeks.   Outpatient follow up:6 weeks Follow up Appt:No future appointments. Follow up Visit:No Follow-up on file.  Postpartum contraception: Depo Provera  Newborn Data: Live born female  Birth Weight: 6 lb 13.7 oz (3110 g) APGAR: 8, 9  Baby Feeding: Breast Disposition:home with mother   12/03/2016 Joylene Igo, CNM

## 2016-12-07 ENCOUNTER — Encounter: Payer: Medicaid Other | Admitting: Obstetrics and Gynecology

## 2017-01-08 IMAGING — US US OB TRANSVAGINAL
1 series · 14 of 28 positions shown · non-contrast
Comparison: None.

CLINICAL DATA: Lower abdominal pain. Nausea for 2 weeks.
Quantitative beta HCG is [DATE].

LMP is03/03/2016.
Gestational age by LMP is5 weeks 6 days.
EDC by LMP is12/08/2016.
EXAM:
OBSTETRIC <14 WK US AND TRANSVAGINAL OB US
TECHNIQUE: Both transabdominal and transvaginal ultrasound examinations were
performed for complete evaluation of the gestation as well as the
maternal uterus, adnexal regions, and pelvic cul-de-sac.
Transvaginal technique was performed to assess early pregnancy.

[Series 1: us ob transvaginal · 0.15mm/px · 14 of 174 slices shown]
[im 7/174]
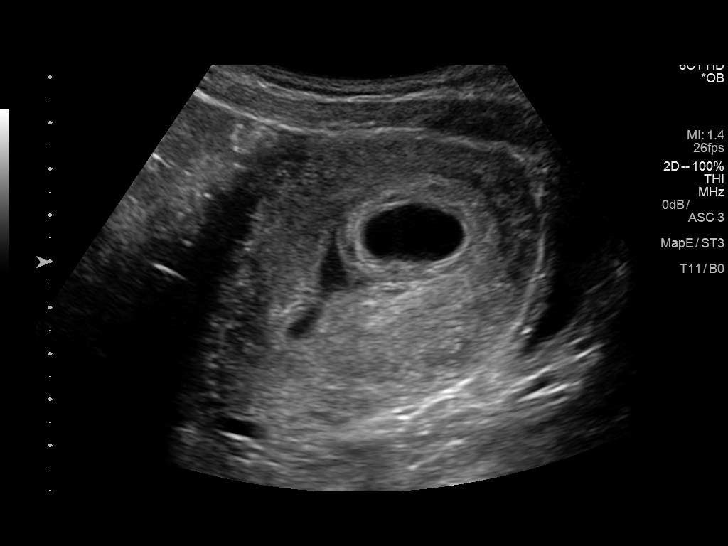
[im 20/174]
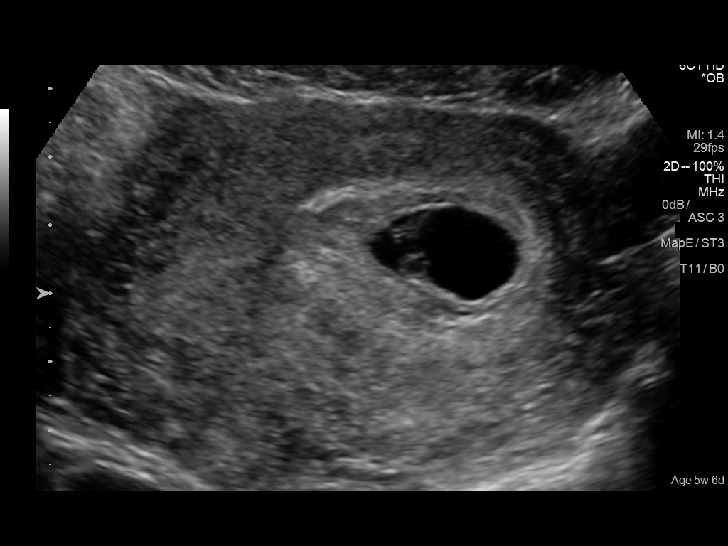
[im 33/174]
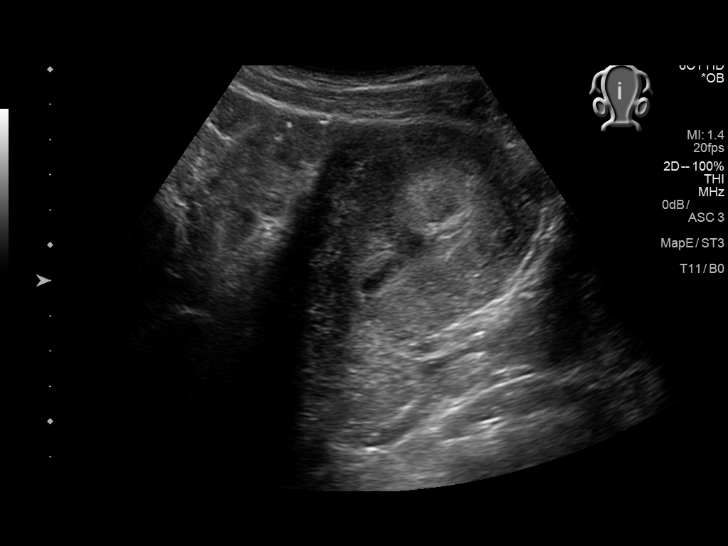
[im 45/174]
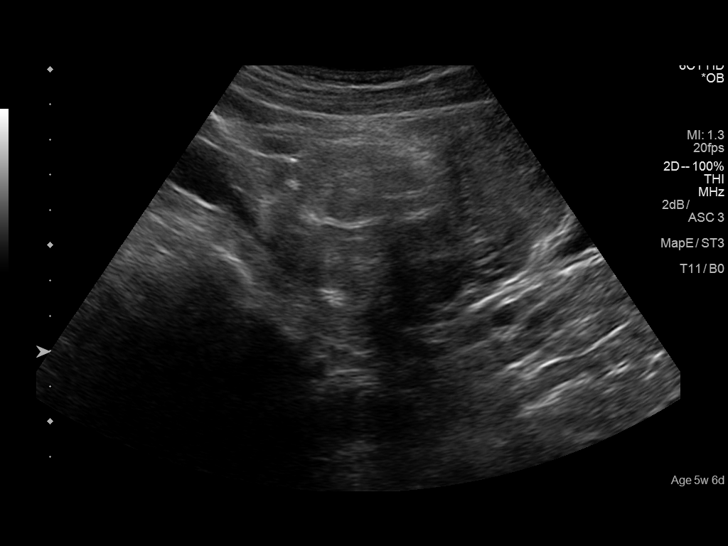
[im 58/174]
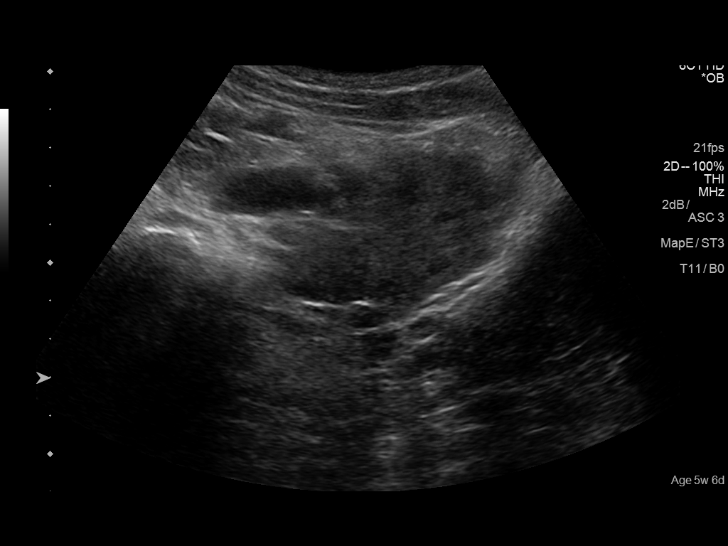
[im 71/174]
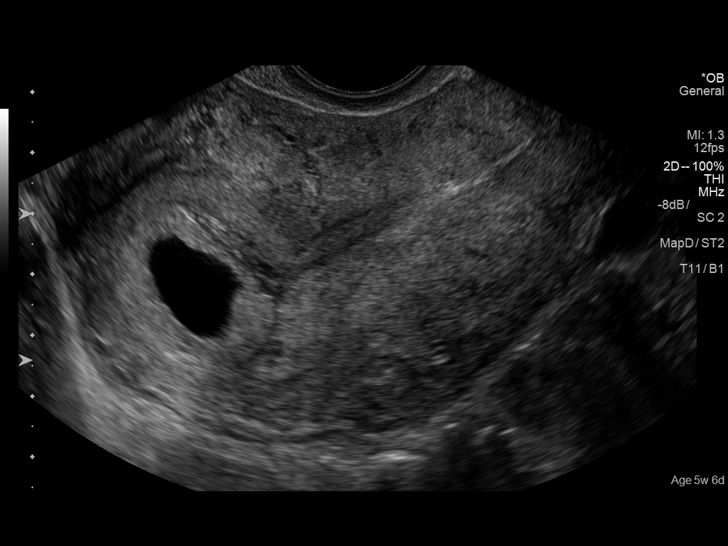
[im 84/174]
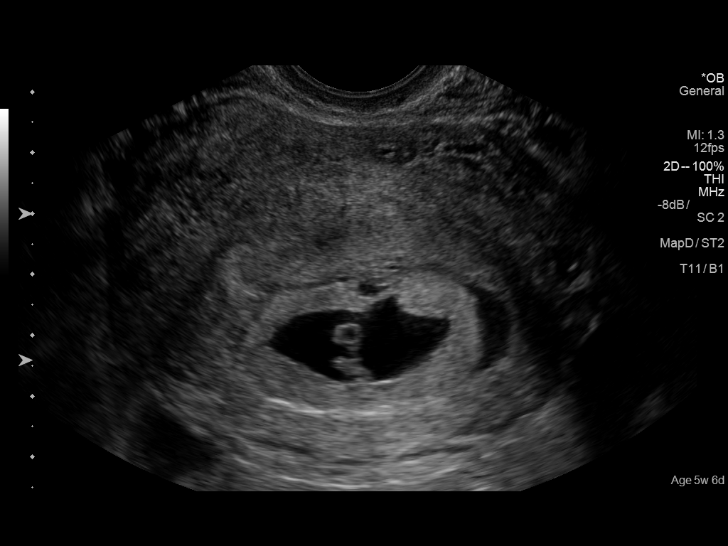
[im 97/174]
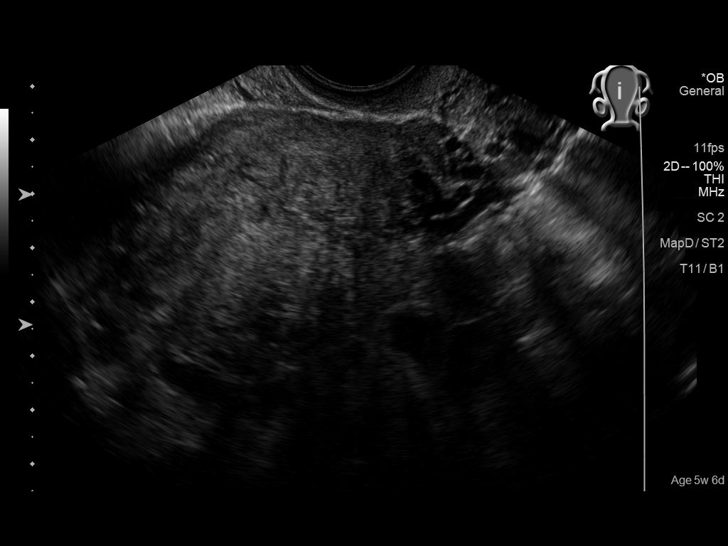
[im 109/174]
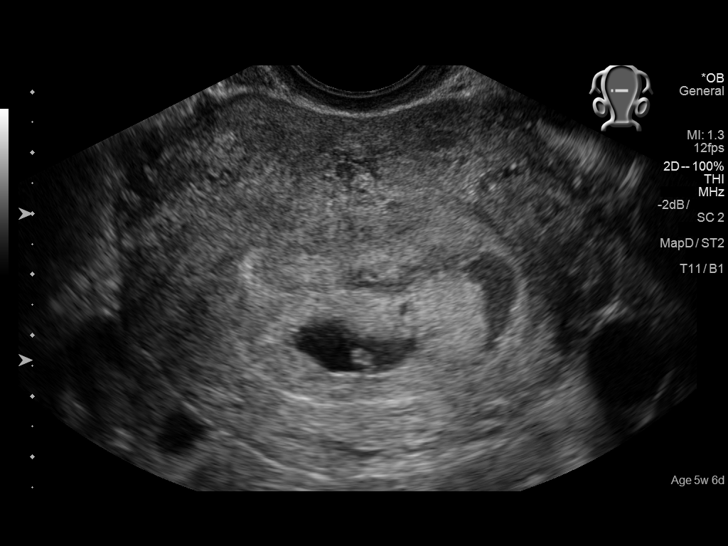
[im 122/174]
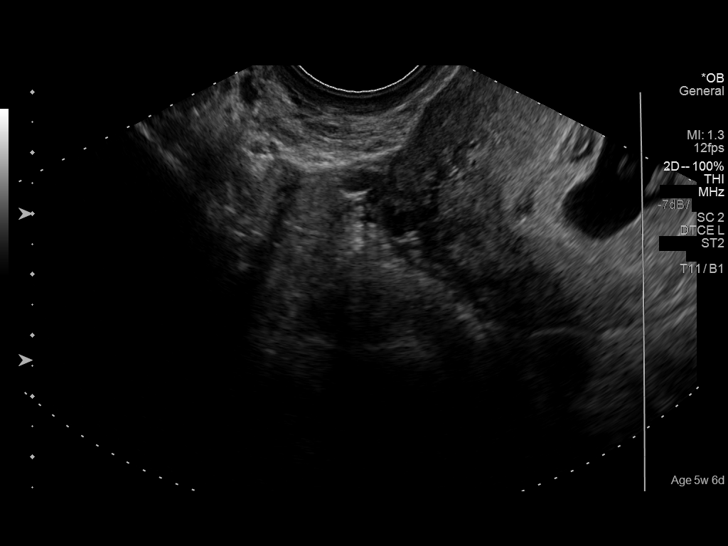
[im 135/174]
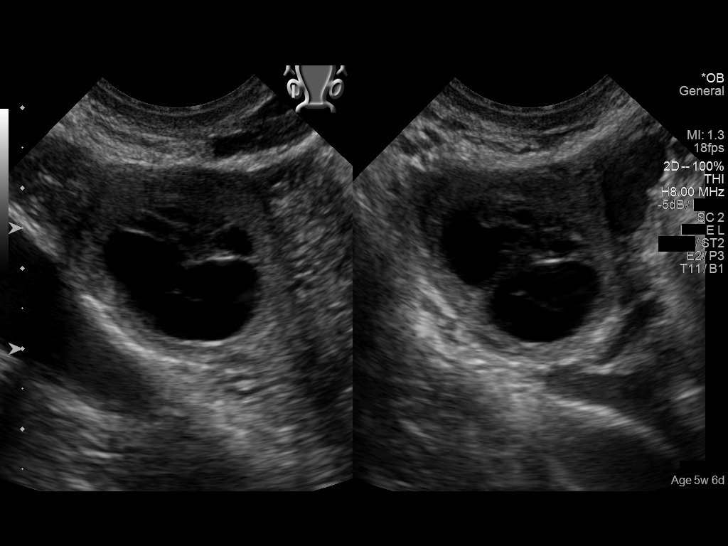
[im 148/174]
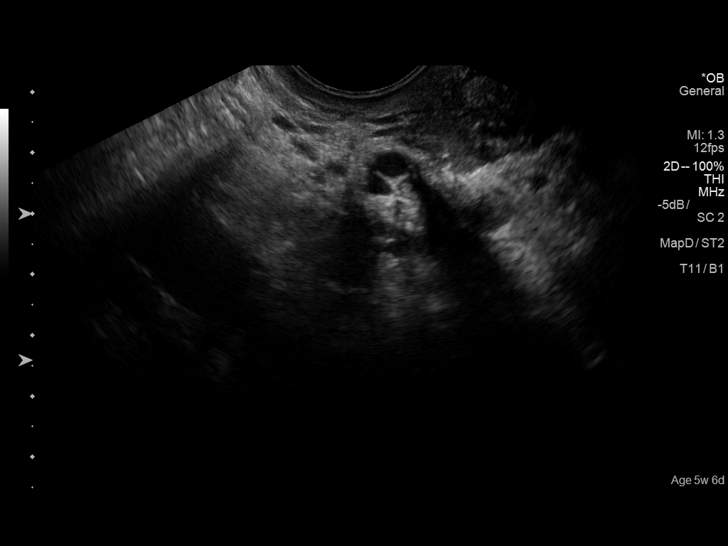
[im 161/174]
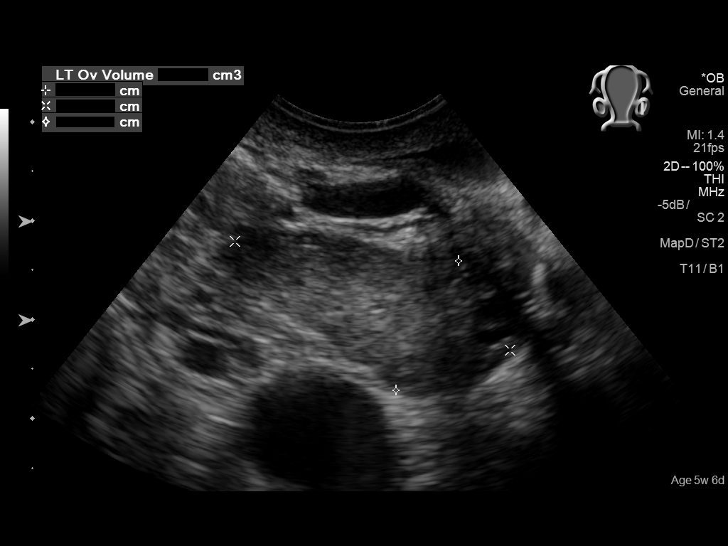
[im 174/174]
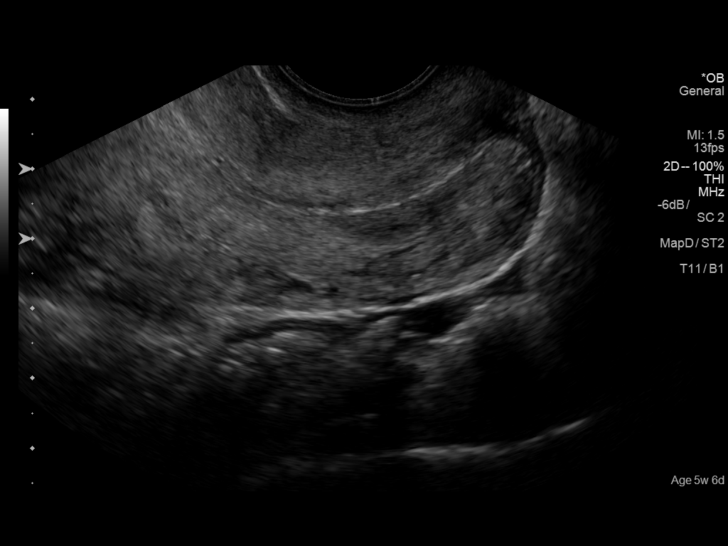

[14 of 28 positions shown; findings below may reference images not displayed]

FINDINGS: Intrauterine gestational sac: Present

Yolk sac:  Present

Embryo:  Present

Cardiac Activity: Present

Heart Rate: 132  bpm

CRL:  6.3  mm   6 w   3 d                  US EDC: 12/04/2016

Subchorionic hemorrhage:  None

Maternal uterus/adnexae: Normal appearance of the left ovary. Mildly
complex cystic structure within the right ovary, measuring 1.4 x
cm. Internal septations are present. Findings likely related to an
involuting corpus luteum cyst. Trace free pelvic fluid.
IMPRESSION: 1. Single living intrauterine embryo.
2. Today's ultrasound confirms clinical EDC of 12/08/2016.
3. Probable right corpus luteum cyst.

## 2017-01-18 ENCOUNTER — Encounter: Payer: Self-pay | Admitting: Obstetrics and Gynecology

## 2017-01-18 ENCOUNTER — Ambulatory Visit (INDEPENDENT_AMBULATORY_CARE_PROVIDER_SITE_OTHER): Payer: Medicaid Other | Admitting: Obstetrics and Gynecology

## 2017-01-18 NOTE — Progress Notes (Signed)
   Subjective:     Natalie Scott is a 26 y.o. female who presents for a postpartum visit. She is 6 weeks postpartum following a spontaneous vaginal delivery. I have fully reviewed the prenatal and intrapartum course. The delivery was at 38 gestational weeks. Outcome: spontaneous vaginal delivery. Anesthesia: none. Postpartum course has been uncomplicated. Baby's course has been uncomplicated. Baby is feeding by breast. Bleeding no bleeding. Bowel function is normal. Bladder function is normal. Patient is not sexually active. Contraception method is abstinence. Postpartum depression screening: negative.  The following portions of the patient's history were reviewed and updated as appropriate: allergies, current medications, past family history, past medical history, past social history, past surgical history and problem list.  Review of Systems A comprehensive review of systems was negative.   Objective:    BP 130/84   Pulse 75   Ht 5\' 6"  (1.676 m)   Wt 146 lb (66.2 kg)   Breastfeeding? Yes   BMI 23.57 kg/m   General:  alert, cooperative and appears stated age   Breasts:  inspection negative, no nipple discharge or bleeding, no masses or nodularity palpable  Lungs: clear to auscultation bilaterally  Heart:  regular rate and rhythm, S1, S2 normal, no murmur, click, rub or gallop  Abdomen: soft, non-tender; bowel sounds normal; no masses,  no organomegaly   Vulva:  normal  Vagina: normal vagina, no discharge, exudate, lesion, or erythema  Cervix:  multiparous appearance  Corpus: normal size, contour, position, consistency, mobility, non-tender  Adnexa:  normal adnexa and no mass, fullness, tenderness  Rectal Exam: Not performed.        Assessment:     6 weeks postpartum exam. Pap smear not done at today's visit.  Secondary amenorrhea Plan:    1. Contraception: Depo-Provera injections 2. Secondary amenorrhea 3. Follow up in: 3 months for pap and next depo or as needed.

## 2017-01-18 NOTE — Patient Instructions (Signed)
  Place postpartum visit patient instructions here.  

## 2017-01-19 ENCOUNTER — Telehealth: Payer: Self-pay | Admitting: *Deleted

## 2017-01-19 ENCOUNTER — Other Ambulatory Visit: Payer: Self-pay | Admitting: Obstetrics and Gynecology

## 2017-01-19 LAB — CBC
HEMATOCRIT: 41.4 % (ref 34.0–46.6)
Hemoglobin: 14.1 g/dL (ref 11.1–15.9)
MCH: 27.3 pg (ref 26.6–33.0)
MCHC: 34.1 g/dL (ref 31.5–35.7)
MCV: 80 fL (ref 79–97)
PLATELETS: 207 10*3/uL (ref 150–379)
RBC: 5.17 x10E6/uL (ref 3.77–5.28)
RDW: 15.4 % (ref 12.3–15.4)
WBC: 5.6 10*3/uL (ref 3.4–10.8)

## 2017-01-19 LAB — FERRITIN: Ferritin: 70 ng/mL (ref 15–150)

## 2017-01-19 LAB — VITAMIN D 25 HYDROXY (VIT D DEFICIENCY, FRACTURES): VIT D 25 HYDROXY: 7.9 ng/mL — AB (ref 30.0–100.0)

## 2017-01-19 MED ORDER — VITAMIN D (ERGOCALCIFEROL) 1.25 MG (50000 UNIT) PO CAPS: 50000.0000 [IU] | ORAL_CAPSULE | ORAL | 2 refills | Status: DC

## 2017-01-19 NOTE — Telephone Encounter (Signed)
Mailed info to pt 

## 2017-01-19 NOTE — Telephone Encounter (Signed)
-----   Message from Purcell NailsMelody N Shambley, [place] sent at 01/19/2017  9:29 AM EST ----- Please mail info on vit D

## 2017-04-19 ENCOUNTER — Emergency Department
Admission: EM | Admit: 2017-04-19 | Discharge: 2017-04-19 | Disposition: A | Discharge status: Home / Self Care | Payer: Medicaid Other | Attending: Emergency Medicine | Admitting: Emergency Medicine

## 2017-04-19 ENCOUNTER — Emergency Department: Payer: Medicaid Other

## 2017-04-19 ENCOUNTER — Encounter: Payer: Self-pay | Admitting: Medical Oncology

## 2017-04-19 DIAGNOSIS — E876 Hypokalemia: Secondary | ICD-10-CM | POA: Diagnosis not present

## 2017-04-19 DIAGNOSIS — R1010 Upper abdominal pain, unspecified: Secondary | ICD-10-CM | POA: Diagnosis not present

## 2017-04-19 DIAGNOSIS — R112 Nausea with vomiting, unspecified: Secondary | ICD-10-CM | POA: Diagnosis present

## 2017-04-19 LAB — URINALYSIS, COMPLETE (UACMP) WITH MICROSCOPIC
BILIRUBIN URINE: NEGATIVE
Bacteria, UA: NONE SEEN
Glucose, UA: NEGATIVE mg/dL
HGB URINE DIPSTICK: NEGATIVE
Ketones, ur: 20 mg/dL — AB
NITRITE: NEGATIVE
PH: 7 (ref 5.0–8.0)
Protein, ur: NEGATIVE mg/dL
SPECIFIC GRAVITY, URINE: 1.024 (ref 1.005–1.030)

## 2017-04-19 LAB — COMPREHENSIVE METABOLIC PANEL
ALBUMIN: 4.6 g/dL (ref 3.5–5.0)
ALK PHOS: 68 U/L (ref 38–126)
ALT: 12 U/L — AB (ref 14–54)
AST: 18 U/L (ref 15–41)
Anion gap: 10 (ref 5–15)
BUN: 8 mg/dL (ref 6–20)
CALCIUM: 9.6 mg/dL (ref 8.9–10.3)
CO2: 23 mmol/L (ref 22–32)
CREATININE: 0.87 mg/dL (ref 0.44–1.00)
Chloride: 107 mmol/L (ref 101–111)
GFR calc Af Amer: >60 mL/min (ref >60)
GFR calc non Af Amer: >60 mL/min (ref >60)
Glucose, Bld: 100 mg/dL — ABNORMAL HIGH (ref 65–99)
POTASSIUM: 3.1 mmol/L — AB (ref 3.5–5.1)
SODIUM: 140 mmol/L (ref 135–145)
TOTAL PROTEIN: 7.7 g/dL (ref 6.5–8.1)
Total Bilirubin: 1 mg/dL (ref 0.3–1.2)

## 2017-04-19 LAB — CBC
HCT: 43.5 % (ref 35.0–47.0)
Hemoglobin: 14.3 g/dL (ref 12.0–16.0)
MCH: 28.1 pg (ref 26.0–34.0)
MCHC: 32.9 g/dL (ref 32.0–36.0)
MCV: 85.3 fL (ref 80.0–100.0)
PLATELETS: 220 10*3/uL (ref 150–440)
RBC: 5.1 MIL/uL (ref 3.80–5.20)
RDW: 14.5 % (ref 11.5–14.5)
WBC: 15.9 10*3/uL — AB (ref 3.6–11.0)

## 2017-04-19 LAB — LIPASE, BLOOD: Lipase: 25 U/L (ref 11–51)

## 2017-04-19 LAB — POCT PREGNANCY, URINE: Preg Test, Ur: NEGATIVE

## 2017-04-19 NOTE — Discharge Instructions (Signed)
You were evaluated for several weeks of intermittent nausea and decreased appetite, and intermittent abdominal pain. Although no certain cause was found, your examine evaluation are reassuring in the emergency department today. Next the next I suggested pelvic exam today, and you declined this. We discussed next steps might include CT of the abdomen, but chose to hold off on this today.  Return to emergency room immediately for any worsening abdominal pain, black or bloody stools, fever, vaginal discharge, vomiting blood, dizziness or passing out, or any other symptoms concerning to you.

## 2017-04-19 NOTE — ED Triage Notes (Signed)
Pt reports that she has been having lower abd pain and vomiting intermittently over the past 3 weeks. Pt is 5 months post partum and breast feeding. Pt denies fever.

## 2017-04-19 NOTE — ED Notes (Signed)
PAtient stated to this RN "I have to pick up my kid from the bus and need to leave before my results come back." Patient was already in hallway with stuff and baby in carrier when relaying this to me. Patient did not want to wait to get vitals rechecked or speak with MD again. Patient ambulated out with no problems

## 2017-04-19 NOTE — ED Provider Notes (Signed)
[facility] Department Provider Note ____________________________________________   I have reviewed the triage vital signs and the triage nursing note.  HISTORY  Chief Complaint Abdominal Pain and Emesis   Historian Patient  HPI Natalie Scott is a 26 y.o. female who is 5 months postpartum, currently breast-feeding, reports that about 2-3 weeks ago she started developing nausea and decreased appetite. She has had several episodes of vomiting after eating. Occasional social have abdominal pain which is mid abdomen. No lower abdominal tenderness. Denies any new or unusual vaginal discharge or vaginal pain or vaginal bleeding.  No chest pain or trouble breathing. No fever. No change in bowels.  Currently no abdominal pain.    Past Medical History:  Diagnosis Date  . Anemia   . Eczema   . Headache   . History of anemia   . Hx: UTI (urinary tract infection)     Patient Active Problem List   Diagnosis Date Noted  . Anemia 09/15/2016  . Marijuana use 05/04/2016    History reviewed. No pertinent surgical history.  Prior to Admission medications   Medication Sig Start Date End Date Taking? Authorizing Provider  medroxyPROGESTERone (DEPO-PROVERA) 150 MG/ML injection Inject 1 mL (150 mg total) into the muscle every 3 (three) months. Patient not taking: Reported on 01/18/2017 12/03/16   Melody N Shambley, CNM  Vitamin D, Ergocalciferol, (DRISDOL) 50000 units CAPS capsule Take 1 capsule (50,000 Units total) by mouth 2 (two) times a week. Patient not taking: Reported on 04/19/2017 01/20/17   Melody N Shambley, CNM    No Known Allergies  Family History  Problem Relation Age of Onset  . Osteoarthritis Paternal Grandmother   . Asthma Daughter   . Asthma Brother   . Breast cancer Maternal Aunt 50  . Diabetes      maternal side    Social History Social History  Substance Use Topics  . Smoking status: Never Smoker  . Smokeless tobacco: Never Used   . Alcohol use No    Review of Systems  Constitutional: Negative for fever. Eyes: Negative for visual changes. ENT: Negative for sore throat. Cardiovascular: Negative for chest pain. Respiratory: Negative for shortness of breath. Gastrointestinal: Negative for constipation or diarrhea. Genitourinary: Negative for dysuria. Musculoskeletal: Negative for back pain. Skin: Negative for rash. Neurological: Negative for headache. 10 point Review of Systems otherwise negative ____________________________________________   PHYSICAL EXAM:  VITAL SIGNS: ED Triage Vitals  Enc Vitals Group     BP 04/19/17 1238 (!) 119/92     Pulse Rate 04/19/17 1121 71     Resp 04/19/17 1121 18     Temp 04/19/17 1121 97.4 F (36.3 C)     Temp Source 04/19/17 1121 Oral     SpO2 04/19/17 1121 100 %     Weight 04/19/17 1122 131 lb (59.4 kg)     Height 04/19/17 1122  (1.676 m)     Head Circumference --      Peak Flow --      Pain Score 04/19/17 1121 10     Pain Loc --      Pain Edu? --      Excl. in GC? --      Constitutional: Alert and oriented. Well appearing and in no distress. HEENT   Head: Normocephalic and atraumatic.      Eyes: Conjunctivae are normal. PERRL. Normal extraocular movements.      Ears:         Nose: No congestion/rhinnorhea.  Mouth/Throat: Mucous membranes are moist.   Neck: No stridor. Cardiovascular/Chest: Normal rate, regular rhythm.  No murmurs, rubs, or gallops. Respiratory: Normal respiratory effort without tachypnea nor retractions. Breath sounds are clear and equal bilaterally. No wheezes/rales/rhonchi. Gastrointestinal: Soft. No distention, no guarding, no rebound.  Nontender  Genitourinary/rectal:Deferred Musculoskeletal: Nontender with normal range of motion in all extremities. No joint effusions.  No lower extremity tenderness.  No edema. Neurologic:  Normal speech and language. No gross or focal neurologic deficits are appreciated. Skin:  Skin  is warm, dry and intact. No rash noted. Psychiatric: Mood and affect are normal. Speech and behavior are normal. Patient exhibits appropriate insight and judgment.   ____________________________________________  LABS (pertinent positives/negatives)  Labs Reviewed  COMPREHENSIVE METABOLIC PANEL - Abnormal; Notable for the following:       Result Value   Potassium 3.1 (*)    Glucose, Bld 100 (*)    ALT 12 (*)    All other components within normal limits  CBC - Abnormal; Notable for the following:    WBC 15.9 (*)    All other components within normal limits  URINALYSIS, COMPLETE (UACMP) WITH MICROSCOPIC - Abnormal; Notable for the following:    Color, Urine YELLOW (*)    APPearance HAZY (*)    Ketones, ur 20 (*)    Leukocytes, UA TRACE (*)    Squamous Epithelial / LPF 0-5 (*)    All other components within normal limits  LIPASE, BLOOD  POC URINE PREG, ED  POCT PREGNANCY, URINE    ____________________________________________    EKG I, Governor Rooks, MD, the attending physician have personally viewed and interpreted all ECGs.  None ____________________________________________  RADIOLOGY All Xrays were viewed by me. Imaging interpreted by Radiologist.  Ultrasound limited right upper quadrant: IMPRESSION: Study within normal limits. __________________________________________  PROCEDURES  Procedure(s) performed: None  Critical Care performed: None  ____________________________________________   ED COURSE / ASSESSMENT AND PLAN  Pertinent labs & imaging results that were available during my care of the patient were reviewed by me and considered in my medical decision making (see chart for details).   [name] is here for several weeks of nausea and decreased appetite, as well as occasional and intermittent mid abdominal pain, associated with eating, midabdomen and possibly epigastric.  The patient describes the pain is not lower. On abdominal exam today she is  not really having abdominal pain to palpation really in 4 quadrants. She is overall well-appearing today.  She is concerned about the ongoing nature of this problem.  She reports same vaginal discharge that she had her postpartum OB/GYN appointment, and she is not concerned about any worsening or concern for STDs. She declines pelvic exam today. I think this is okay because she does not have any pain in the lower abdomen.  She does have an elevated white blood cell count on laboratory studies today. Uncertain etiology. However given this, I did discuss pursuing some intra-abdominal imaging. At this point given the association with eating, I think pursuing right upper quadrant ultrasound is the most beneficial.  We discussed the possibility of CT scan for evaluation of abdominal visceral organs, but given that she is not having ongoing pain right now, and she needs to leave to pick up her other daughter from the school bus, she does not have that done now. I think it is okay given her clinical exam. We did discuss return precautions.   I reviewed the ultrasound results around 2:30 in, and patient had apparently  left the room, eloped. We had discussed return precautions. We had discussed that if she ended up leaving that she all for the results. However I was in the impression she is going be staying for the results today.   CONSULTATIONS:   None Patient / Family / Caregiver informed of clinical course, medical decision-making process, and agree with plan.   I discussed return precautions, follow-up instructions, and discharge instructions with patient and/or family.  Discharge instructions:  You were evaluated for several weeks of intermittent nausea and decreased appetite, and intermittent abdominal pain. Although no certain cause was found, your examine evaluation are reassuring in the emergency department today. Next the next I suggested pelvic exam today, and you declined this. We discussed  next steps might include CT of the abdomen, but chose to hold off on this today.  Return to emergency room immediately for any worsening abdominal pain, black or bloody stools, fever, vaginal discharge, vomiting blood, dizziness or passing out, or any other symptoms concerning to you. ___________________________________________   FINAL CLINICAL IMPRESSION(S) / ED DIAGNOSES   Final diagnoses:  Nausea & vomiting  Hypokalemia  Pain of upper abdomen              Note: This dictation was prepared with Dragon dictation. Any transcriptional errors that result from this process are unintentional    Governor Rooks, MD 04/19/17 1435

## 2017-10-18 ENCOUNTER — Other Ambulatory Visit: Payer: Self-pay | Admitting: Obstetrics and Gynecology

## 2017-12-13 ENCOUNTER — Encounter: Payer: Self-pay | Admitting: Emergency Medicine

## 2017-12-13 ENCOUNTER — Other Ambulatory Visit: Payer: Self-pay

## 2017-12-13 ENCOUNTER — Emergency Department
Admission: EM | Admit: 2017-12-13 | Discharge: 2017-12-13 | Disposition: A | Discharge status: Home / Self Care | Payer: Medicaid Other | Attending: Emergency Medicine | Admitting: Emergency Medicine

## 2017-12-13 DIAGNOSIS — R1084 Generalized abdominal pain: Secondary | ICD-10-CM | POA: Insufficient documentation

## 2017-12-13 DIAGNOSIS — Z79899 Other long term (current) drug therapy: Secondary | ICD-10-CM | POA: Diagnosis not present

## 2017-12-13 LAB — CBC
HEMATOCRIT: 37.9 % (ref 35.0–47.0)
Hemoglobin: 12.5 g/dL (ref 12.0–16.0)
MCH: 29.1 pg (ref 26.0–34.0)
MCHC: 33.1 g/dL (ref 32.0–36.0)
MCV: 87.8 fL (ref 80.0–100.0)
Platelets: 235 10*3/uL (ref 150–440)
RBC: 4.32 MIL/uL (ref 3.80–5.20)
RDW: 14.1 % (ref 11.5–14.5)
WBC: 10 10*3/uL (ref 3.6–11.0)

## 2017-12-13 LAB — URINALYSIS, COMPLETE (UACMP) WITH MICROSCOPIC
BACTERIA UA: NONE SEEN
BILIRUBIN URINE: NEGATIVE
Glucose, UA: NEGATIVE mg/dL
Hgb urine dipstick: NEGATIVE
KETONES UR: NEGATIVE mg/dL
LEUKOCYTES UA: NEGATIVE
Nitrite: NEGATIVE
Protein, ur: NEGATIVE mg/dL
SPECIFIC GRAVITY, URINE: 1.019 (ref 1.005–1.030)
pH: 6 (ref 5.0–8.0)

## 2017-12-13 LAB — COMPREHENSIVE METABOLIC PANEL
ALBUMIN: 4 g/dL (ref 3.5–5.0)
ALT: 13 U/L — AB (ref 14–54)
AST: 17 U/L (ref 15–41)
Alkaline Phosphatase: 51 U/L (ref 38–126)
Anion gap: 6 (ref 5–15)
BUN: 10 mg/dL (ref 6–20)
CHLORIDE: 106 mmol/L (ref 101–111)
CO2: 25 mmol/L (ref 22–32)
CREATININE: 0.63 mg/dL (ref 0.44–1.00)
Calcium: 9 mg/dL (ref 8.9–10.3)
GFR calc Af Amer: >60 mL/min (ref >60)
GLUCOSE: 97 mg/dL (ref 65–99)
POTASSIUM: 3.6 mmol/L (ref 3.5–5.1)
SODIUM: 137 mmol/L (ref 135–145)
Total Bilirubin: 0.6 mg/dL (ref 0.3–1.2)
Total Protein: 6.8 g/dL (ref 6.5–8.1)

## 2017-12-13 LAB — LIPASE, BLOOD: LIPASE: 27 U/L (ref 11–51)

## 2017-12-13 LAB — POCT PREGNANCY, URINE: PREG TEST UR: NEGATIVE

## 2017-12-13 MED ORDER — DICYCLOMINE HCL 10 MG PO CAPS: 10.0000 mg | ORAL_CAPSULE | Freq: Four times a day (QID) | ORAL | 0 refills | Status: DC

## 2017-12-13 NOTE — ED Triage Notes (Signed)
Pt with abd pain started last night with some nausea and little diarrhea.

## 2017-12-13 NOTE — ED Provider Notes (Signed)
[facility] Emergency Department Provider Note  ____________________________________________   First MD Initiated Contact with Patient 12/13/17 1219     (approximate)  I have reviewed the triage vital signs and the nursing notes.   HISTORY  Chief Complaint Abdominal Pain    HPI Natalie Scott is a 26 y.o. female with medical history as listed below who presents for evaluation of several months (possibly as long as a year or more) of intermittent abdominal pain.  She describes the pain as being in her lower abdomen around her bellybutton and it only occurs after she eats.  It has made her less interested in eating and she has lost a significant amount of weight over the last few months or maybe the last year.  She also admits to eating quite a bit less than usual because she is not interested in because she hurts when she does so.  She had a right upper quadrant ultrasound in April which was normal.  She does not have a primary care provider.  She denies fever/chills, night sweats, chest pain, shortness of breath, nausea, vomiting.  She occasionally has some loose stools but not often.  She has had no vaginal or urinary symptoms and she has no pain with sexual intercourse, no loss of bladder continence or increased urinary frequency.  Past Medical History:  Diagnosis Date  . Anemia   . Eczema   . Headache   . History of anemia   . Hx: UTI (urinary tract infection)     Patient Active Problem List   Diagnosis Date Noted  . Anemia 09/15/2016  . Marijuana use 05/04/2016    History reviewed. No pertinent surgical history.  Prior to Admission medications   Medication Sig Start Date End Date Taking? Authorizing Provider  dicyclomine (BENTYL) 10 MG capsule Take 1 capsule (10 mg total) by mouth 4 (four) times daily for 14 days. 12/13/17 12/27/17  Loleta RoseForbach, Kona Yusuf, MD  fluconazole (DIFLUCAN) 150 MG tablet TAKE ONE TABLET BY MOUTH FOR ONE DOSE. CAN TAKE ADDITIONAL  TABLET THREE DAYS LATER IF SYMPTOMS PERSIST Patient not taking: Reported on 12/13/2017 10/19/17   Shambley, Melody N, CNM  medroxyPROGESTERone (DEPO-PROVERA) 150 MG/ML injection Inject 1 mL (150 mg total) into the muscle every 3 (three) months. Patient not taking: Reported on 01/18/2017 12/03/16   Purcell NailsShambley, Melody N, CNM  Vitamin D, Ergocalciferol, (DRISDOL) 50000 units CAPS capsule Take 1 capsule (50,000 Units total) by mouth 2 (two) times a week. Patient not taking: Reported on 04/19/2017 01/20/17   Purcell NailsShambley, Melody N, CNM    Allergies Patient has no known allergies.  Family History  Problem Relation Age of Onset  . Osteoarthritis Paternal Grandmother   . Asthma Daughter   . Asthma Brother   . Breast cancer Maternal Aunt 50  . Diabetes Unknown        maternal side    Social History Social History   Tobacco Use  . Smoking status: Never Smoker  . Smokeless tobacco: Never Used  Substance Use Topics  . Alcohol use: No    Alcohol/week: 0.0 oz  . Drug use: Yes    Types: Marijuana    Comment: not since pregnant    Review of Systems Constitutional: No fever/chills Eyes: No visual changes. ENT: No sore throat. Cardiovascular: Denies chest pain. Respiratory: Denies shortness of breath. Gastrointestinal: Episodic abdominal pain after she eats for close to a year Genitourinary: Negative for dysuria. Musculoskeletal: Negative for neck pain.  Negative for back pain. Integumentary:  Negative for rash. Neurological: Negative for headaches, focal weakness or numbness.   ____________________________________________   PHYSICAL EXAM:  ED Triage Vitals  Enc Vitals Group     BP 12/13/17 1130 111/68     Pulse Rate 12/13/17 1330 80     Resp 12/13/17 1351 14     Temp 12/13/17 1351 98 F (36.7 C)     Temp Source 12/13/17 1351 Oral     SpO2 12/13/17 1330 100 %     Weight 12/13/17 1032 59.4 kg (131 lb)     Height --      Head Circumference --      Peak Flow --      Pain Score  12/13/17 1032 8     Pain Loc --      Pain Edu? --      Excl. in GC? --      Constitutional: Alert and oriented. Well appearing and in no acute distress. Eyes: Conjunctivae are normal.  Head: Atraumatic. Cardiovascular: Normal rate, regular rhythm. Good peripheral circulation. Grossly normal heart sounds. Respiratory: Normal respiratory effort.  No retractions. Lungs CTAB. Gastrointestinal: Soft and nontender. No distention.  Patient does have sequela of recent weight loss with quite a bit of what I would describe as "extra skin" Genitourinary: Deferred via shared decision making between patient and provider and the patient's preference Musculoskeletal: No lower extremity tenderness nor edema. No gross deformities of extremities. Neurologic:  Normal speech and language. No gross focal neurologic deficits are appreciated.  Skin:  Skin is warm, dry and intact. No rash noted. Psychiatric: Mood and affect are normal. Speech and behavior are normal.  ____________________________________________   LABS (all labs ordered are listed, but only abnormal results are displayed)  Labs Reviewed  COMPREHENSIVE METABOLIC PANEL - Abnormal; Notable for the following components:      Result Value   ALT 13 (*)    All other components within normal limits  URINALYSIS, COMPLETE (UACMP) WITH MICROSCOPIC - Abnormal; Notable for the following components:   Color, Urine YELLOW (*)    APPearance HAZY (*)    Squamous Epithelial / LPF 6-30 (*)    All other components within normal limits  LIPASE, BLOOD  CBC  POCT PREGNANCY, URINE  POC URINE PREG, ED   ____________________________________________  EKG  None - EKG not ordered by ED physician ____________________________________________  RADIOLOGY   No results found.  ____________________________________________   PROCEDURES  Critical Care performed: No   Procedure(s) performed:    Procedures   ____________________________________________   INITIAL IMPRESSION / ASSESSMENT AND PLAN / ED COURSE  As part of my medical decision making, I reviewed the following data within the electronic MEDICAL RECORD NUMBER Nursing notes reviewed and incorporated and Labs reviewed     Differential diagnosis includes, but is not limited to, ovarian cyst, ovarian torsion, acute appendicitis, diverticulitis, urinary tract infection/pyelonephritis, endometriosis, bowel obstruction, colitis, renal colic, gastroenteritis, hernia, fibroids, endometriosis, pregnancy related pain including ectopic pregnancy, etc.    However, the patient has been having symptoms for what sounds like close to a year.  Her vital signs are normal.  Her lab work is all reassuring as well with no evidence of any acute abnormality.  Vital signs are within normal limits for her body habitus.  Her physical exam is reassuring with no tenderness to palpation although it is obvious that she is telling the truth about having lost weight over at least some relatively recent period of time.  Given no tenderness to palpation  and no other acute or emergent symptoms at this time, I do not feel she would benefit from emergent imaging today in the department.  I explained this to the patient.  We also discussed the possibility of doing a pelvic exam but she does not really want one and I explained that I did not think it would be useful or beneficial to her at this time so we agreed that deferring that would be appropriate.  It seems that her weight loss is most likely related to the lack of appetite and decreased oral intake.  She would like to follow-up with GI and I attempted to facilitate that by sending a message to Dr. Tobi Bastos through [facility].  I am also going to give her a prescription for Bentyl which may help with her symptoms.  I gave my usual customary return precautions and she understands and agrees with the plan.      ____________________________________________  FINAL CLINICAL IMPRESSION(S) / ED DIAGNOSES  Final diagnoses:  Generalized postprandial abdominal pain     MEDICATIONS GIVEN DURING THIS VISIT:  Medications - No data to display   ED Discharge Orders        Ordered    dicyclomine (BENTYL) 10 MG capsule  4 times daily     12/13/17 1358       Note:  This document was prepared using Dragon voice recognition software and may include unintentional dictation errors.    Loleta Rose, MD 12/13/17 [phone]

## 2017-12-13 NOTE — ED Notes (Signed)
Pt unable to urinate at this time, given specimen cup for when is able to void and sent back out to the lobby. 

## 2017-12-13 NOTE — Discharge Instructions (Signed)
You have been seen in the Emergency Department (ED) for abdominal pain.  Your evaluation did not identify a clear cause of your symptoms but was generally reassuring.  Please follow up as instructed above regarding today?s emergent visit and the symptoms that are bothering you.  Dr. Johnney KillianAnna's office should reach out to you to help you schedule a follow up appointment with the Gastroenterology clinic.  Return to the ED if your abdominal pain worsens or fails to improve, you develop bloody vomiting, bloody diarrhea, you are unable to tolerate fluids due to vomiting, fever greater than 101, or other symptoms that concern you.

## 2017-12-21 ENCOUNTER — Telehealth: Payer: Self-pay

## 2017-12-21 ENCOUNTER — Ambulatory Visit (INDEPENDENT_AMBULATORY_CARE_PROVIDER_SITE_OTHER): Payer: Medicaid Other | Admitting: Gastroenterology

## 2017-12-21 ENCOUNTER — Encounter: Payer: Self-pay | Admitting: Gastroenterology

## 2017-12-21 ENCOUNTER — Other Ambulatory Visit: Payer: Self-pay

## 2017-12-21 VITALS — BP 95/65 | HR 86 | Temp 97.3°F | Ht 66.0 in | Wt 114.8 lb

## 2017-12-21 DIAGNOSIS — R634 Abnormal weight loss: Secondary | ICD-10-CM | POA: Diagnosis not present

## 2017-12-21 DIAGNOSIS — R1013 Epigastric pain: Secondary | ICD-10-CM | POA: Diagnosis not present

## 2017-12-21 MED ORDER — OMEPRAZOLE 40 MG PO CPDR: 40.0000 mg | DELAYED_RELEASE_CAPSULE | Freq: Every day | ORAL | 3 refills | Status: DC

## 2017-12-21 NOTE — Telephone Encounter (Signed)
Patient has been informed of her CT Abd and [facility] appt [facility] on 01[facility]18 arrival time 10:45 am for 11am appt.  Instructed her nothing to eat or drink after 4 hours prior.  Thanks [employer]le

## 2017-12-21 NOTE — Patient Instructions (Addendum)
Dr. Tobi BastosAnna has advised the following during todays office visit:  1. Go to [facility] for labs to be completed. 2. Stop sodas. 3. Begin trial of Prilosec 40 mg one daily. 4. Begin trial of Ensure. 5. CT scan of abd and pelvis to eval abd pain.  Will call with appt. 6. Follow up with Dr. Tobi BastosAnna in 4-6 weeks.  Thanks, Marcelino DusterMichelle CMA

## 2017-12-21 NOTE — Progress Notes (Signed)
Wyline MoodKiran Jamael Hoffmann MD, MRCP(U.K) [address]  [address]  [place], [place] [address]  Main: [phone]  Fax: [phone]   Gastroenterology Consultation  Referring Provider:     Center, [facility] Primary Care Physician:  [facility], [facility] Primary Gastroenterologist:  Dr. Wyline MoodKiran Madilynn Montante  Reason for Consultation:     Abdominal pain         HPI:   Natalie Scott is a 26 y.o. y/o female .  Was seen in the emergency room on 12/13/2017 with abdominal pain.  Ongoing for at least a year.  At the time of the ER there is some concern for weight loss she was given some Bentyl and asked to follow-up in my office as an outpatient.  She had a right upper quadrant ultrasound in April 2018 which was normal.  Labs 12/13/2017 was negative for pregnancy urine analysis was normal, CBC and CMP including her lipase were normal.   Abdominal pain: Onset: about a year and gradually getting worse. Atleast once a day . Can last between 30 secs to a minute .  Site :belly button, LLQ  Radiation: no  Severity :severe when it happens to the point that she feels sick  Ashby Dawesature of pain: stabbing  Aggravating factors: food , right after she eats in the morning  Relieving factors :goes away on its own  Weight loss: Lost weight - normally weighs 135-140 - 20 lbs below her baseline, apatite is poor, pain is preventing from eating , feels full easily. "food digests well"  NSAID use: does take BC's, ibuprofen , only during her cycle  PPI use :none Gall bladder surgery: intact  Frequency of bowel movements: 1-2 times a day .  Change in bowel movements: sometimes can be soft and sometimes can be hard. Leans more on the softer side  Relief with bowel movements: yes  Gas/Bloating/Abdominal distension: lot of gas  Does not like take her medicines, did not try bentyl. Consumes " a lot of sodas" 6 cans a day , sweet tea " a lot " 3 cups Past Medical History:  Diagnosis Date  . Anemia   . Eczema   .  Headache   . History of anemia   . Hx: UTI (urinary tract infection)     No past surgical history on file.  Prior to Admission medications   Medication Sig Start Date End Date Taking? Authorizing Provider  dicyclomine (BENTYL) 10 MG capsule Take 1 capsule (10 mg total) by mouth 4 (four) times daily for 14 days. 12/13/17 12/27/17  Loleta RoseForbach, Cory, MD  fluconazole (DIFLUCAN) 150 MG tablet TAKE ONE TABLET BY MOUTH FOR ONE DOSE. CAN TAKE ADDITIONAL TABLET THREE DAYS LATER IF SYMPTOMS PERSIST Patient not taking: Reported on 12/13/2017 10/19/17   Shambley, Melody N, CNM  medroxyPROGESTERone (DEPO-PROVERA) 150 MG/ML injection Inject 1 mL (150 mg total) into the muscle every 3 (three) months. Patient not taking: Reported on 01/18/2017 12/03/16   Purcell NailsShambley, Melody N, CNM  Vitamin D, Ergocalciferol, (DRISDOL) 50000 units CAPS capsule Take 1 capsule (50,000 Units total) by mouth 2 (two) times a week. Patient not taking: Reported on 04/19/2017 01/20/17   Purcell NailsShambley, Melody N, CNM    Family History  Problem Relation Age of Onset  . Osteoarthritis Paternal Grandmother   . Asthma Daughter   . Asthma Brother   . Breast cancer Maternal Aunt 50  . Diabetes Unknown        maternal side     Social History  Tobacco Use  . Smoking status: Never Smoker  . Smokeless tobacco: Never Used  Substance Use Topics  . Alcohol use: No    Alcohol/week: 0.0 oz  . Drug use: Yes    Types: Marijuana    Comment: not since pregnant    Allergies as of 12/21/2017  . (No Known Allergies)    Review of Systems:    All systems reviewed and negative except where noted in HPI.   Physical Exam:  LMP 12/06/2017 (Exact Date)  Patient's last menstrual period was 12/06/2017 (exact date). Psych:  Alert and cooperative. Normal mood and affect. General:   Alert,  Well-developed, well-nourished, pleasant and cooperative in NAD Head:  Normocephalic and atraumatic. Eyes:  Sclera clear, no icterus.   Conjunctiva pink. Ears:   Normal auditory acuity. Nose:  No deformity, discharge, or lesions. Mouth:  No deformity or lesions,oropharynx pink & moist. Neck:  Supple; no masses or thyromegaly. Lungs:  Respirations even and unlabored.  Clear throughout to auscultation.   No wheezes, crackles, or rhonchi. No acute distress. Heart:  Regular rate and rhythm; no murmurs, clicks, rubs, or gallops. Abdomen:  Normal bowel sounds.  No bruits.  Soft, non-tender and non-distended without masses, hepatosplenomegaly or hernias noted.  No guarding or rebound tenderness.    Msk:  Symmetrical without gross deformities. Good, equal movement & strength bilaterally. Pulses:  Normal pulses noted. Extremities:  No clubbing or edema.  No cyanosis. Neurologic:  Alert and oriented x3;  grossly normal neurologically. Skin:  Intact without significant lesions or rashes. No jaundice. Lymph Nodes:  No significant cervical adenopathy. Psych:  Alert and cooperative. Normal mood and affect.  Imaging Studies: No results found.  Assessment and Plan:   Natalie Scott is a 26 y.o. y/o female has been referred for abdominal pain , weight loss , she also has a lot of gas, abdominal distension. Consumes atleast 8 cans of sweetened sodas/tea in a day which is a lot of fructose and may cause her bloating. Will proceed with evaluation for dyspepsia.    Plan  1. Stool H pylori antigen and celiac serology  2. Stop al the sodas consumption which can cause her gas 3. Trial of Prilosec 40 mg daily  4. Trial of ensure 5. Ct scan of the abdomen and pelvis to evaluate weight loss and abdominal pain  6. If no better at next visit will consider EGD  Follow up in 4-6 weks   Dr Wyline MoodKiran Earon Rivest MD,MRCP(U.K)

## 2017-12-21 NOTE — Addendum Note (Signed)
Addended by: Avie ArenasTEGA, Kairy Folsom S on: 12/21/2017 12:16 PM   Modules accepted: Orders

## 2018-01-09 ENCOUNTER — Ambulatory Visit: Admission: RE | Admit: 2018-01-09 | Payer: Self-pay | Source: Ambulatory Visit

## 2018-01-14 IMAGING — US US ABDOMEN LIMITED
1 series · 14 of 25 positions shown · non-contrast
Comparison: None.

CLINICAL DATA: Nausea and vomiting for 2 weeks

EXAM:
US ABDOMEN LIMITED - RIGHT UPPER QUADRANT

[Series 1: us abdomen limited · 0.15mm/px · 14 of 39 slices shown]
[im 1/39]
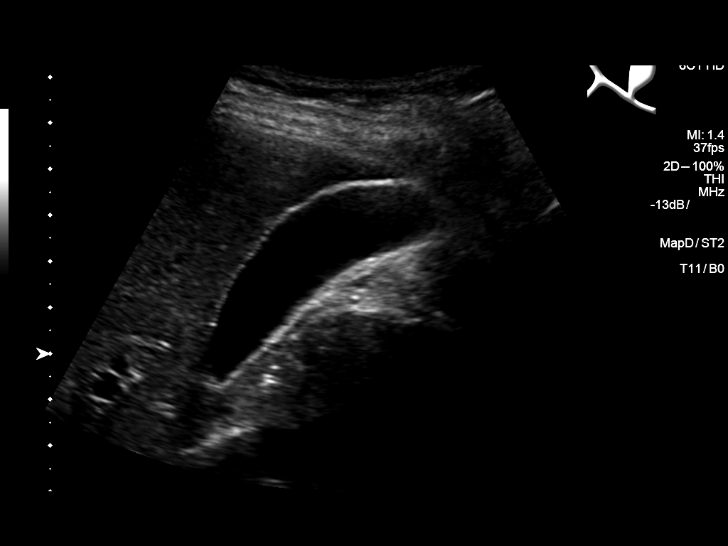
[im 4/39]
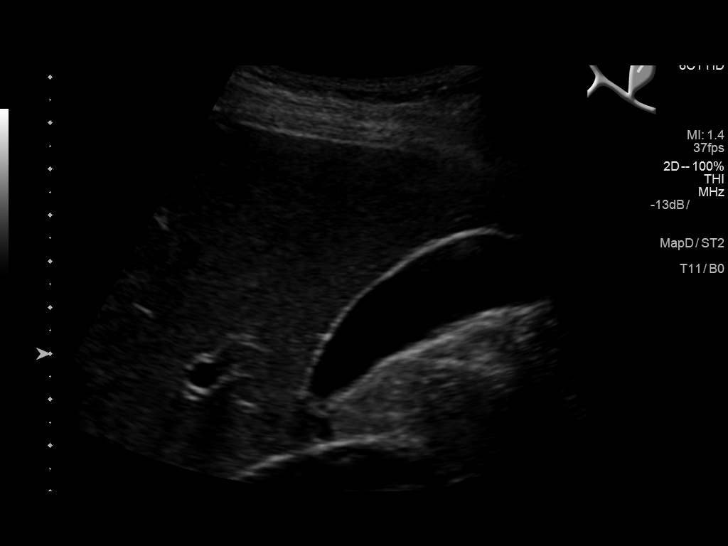
[im 7/39]
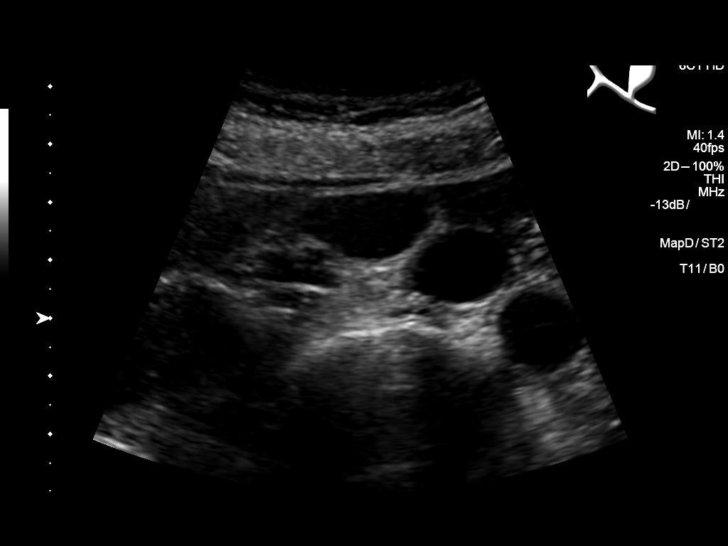
[im 10/39]
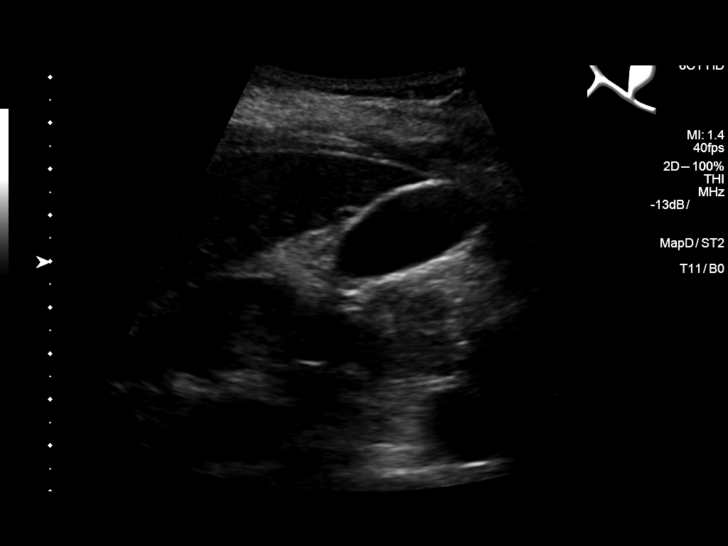
[im 13/39]
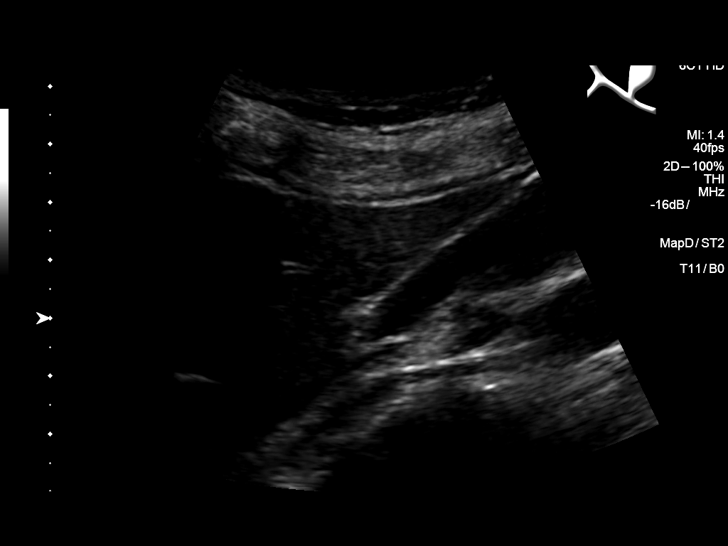
[im 15/39]
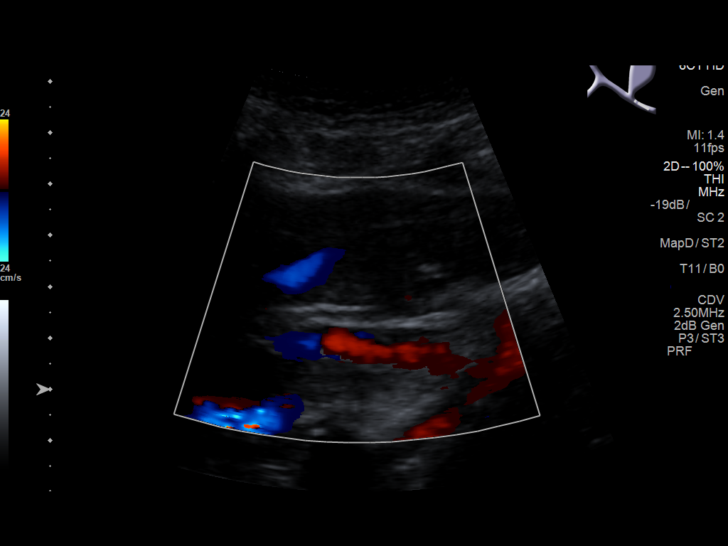
[im 18/39]
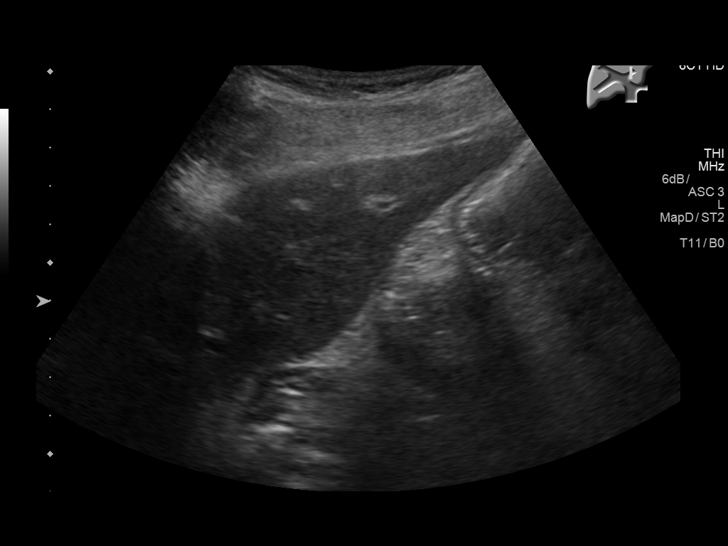
[im 21/39]
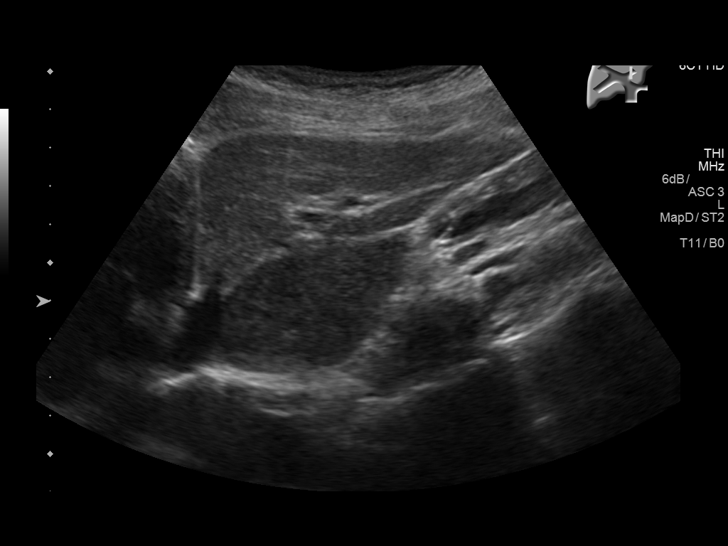
[im 24/39]
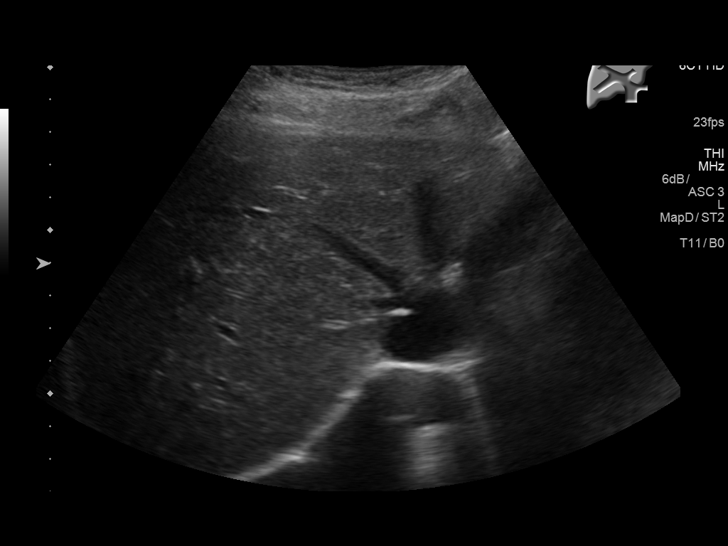
[im 26/39]
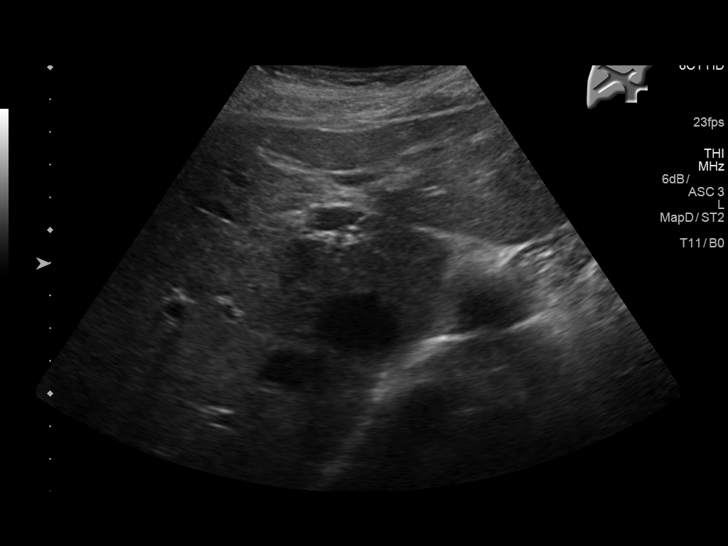
[im 29/39]
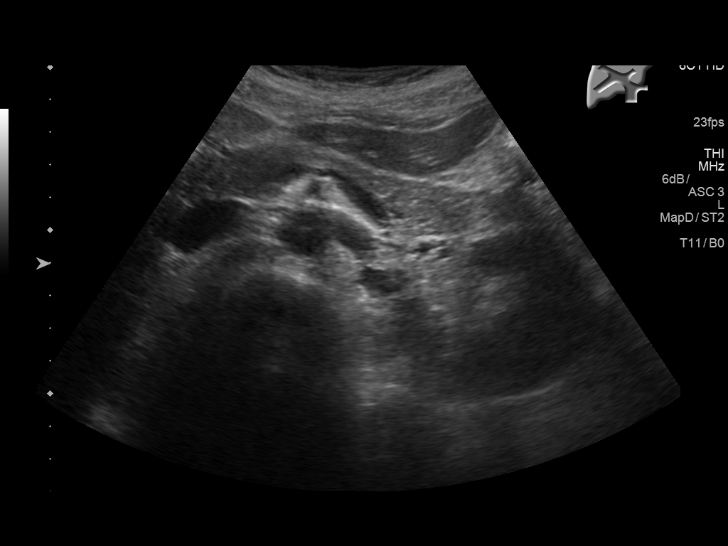
[im 32/39]
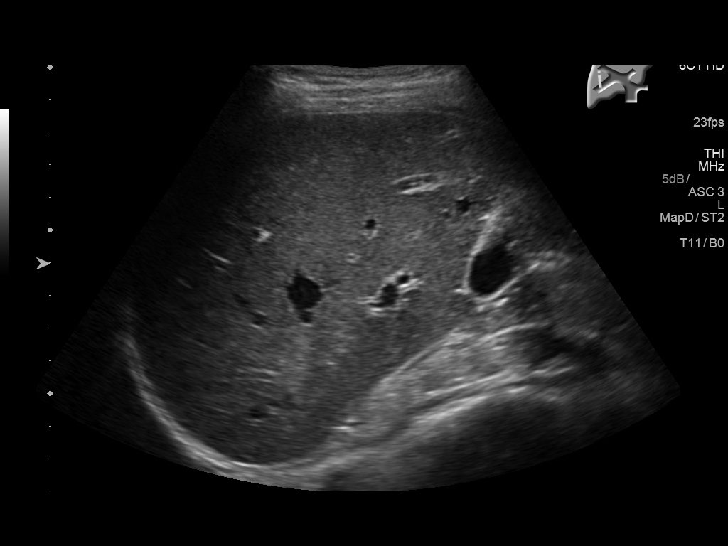
[im 35/39]
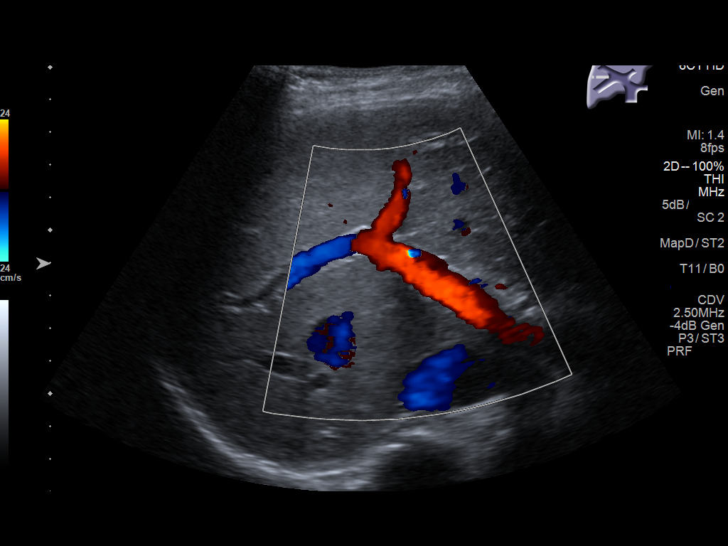
[im 39/39]
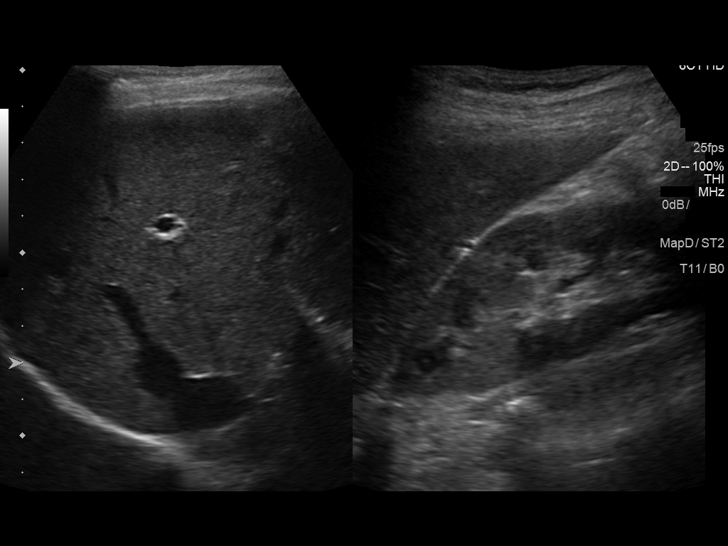

[14 of 25 positions shown; findings below may reference images not displayed]

FINDINGS: Gallbladder:

No gallstones or wall thickening visualized. There is no
pericholecystic fluid. No sonographic Murphy sign noted by
sonographer.

Common bile duct:

Diameter: 2 mm. There is no intrahepatic or extrahepatic biliary
duct dilatation.

Liver:

No focal lesion identified. Within normal limits in parenchymal
echogenicity.
IMPRESSION: Study within normal limits.

## 2018-02-02 ENCOUNTER — Encounter: Payer: Self-pay | Admitting: Gastroenterology

## 2018-02-02 ENCOUNTER — Ambulatory Visit (INDEPENDENT_AMBULATORY_CARE_PROVIDER_SITE_OTHER): Payer: Medicaid Other | Admitting: Gastroenterology

## 2018-02-02 ENCOUNTER — Other Ambulatory Visit
Admission: RE | Admit: 2018-02-02 | Discharge: 2018-02-02 | Disposition: A | Discharge status: Home / Self Care | Payer: Medicaid Other | Source: Ambulatory Visit | Attending: Gastroenterology | Admitting: Gastroenterology

## 2018-02-02 VITALS — BP 103/67 | HR 74 | Temp 97.5°F | Ht 66.0 in | Wt 114.2 lb

## 2018-02-02 DIAGNOSIS — K5900 Constipation, unspecified: Secondary | ICD-10-CM | POA: Diagnosis not present

## 2018-02-02 DIAGNOSIS — R1013 Epigastric pain: Secondary | ICD-10-CM | POA: Insufficient documentation

## 2018-02-02 DIAGNOSIS — R140 Abdominal distension (gaseous): Secondary | ICD-10-CM | POA: Diagnosis not present

## 2018-02-02 LAB — COMPREHENSIVE METABOLIC PANEL
ALBUMIN: 4 g/dL (ref 3.5–5.0)
ALK PHOS: 52 U/L (ref 38–126)
ALT: 19 U/L (ref 14–54)
ANION GAP: 8 (ref 5–15)
AST: 31 U/L (ref 15–41)
BUN: 13 mg/dL (ref 6–20)
CALCIUM: 8.9 mg/dL (ref 8.9–10.3)
CO2: 25 mmol/L (ref 22–32)
Chloride: 104 mmol/L (ref 101–111)
Creatinine, Ser: 0.62 mg/dL (ref 0.44–1.00)
GFR calc Af Amer: >60 mL/min (ref >60)
GFR calc non Af Amer: >60 mL/min (ref >60)
GLUCOSE: 86 mg/dL (ref 65–99)
Potassium: 3.6 mmol/L (ref 3.5–5.1)
SODIUM: 137 mmol/L (ref 135–145)
Total Bilirubin: 0.6 mg/dL (ref 0.3–1.2)
Total Protein: 7.2 g/dL (ref 6.5–8.1)

## 2018-02-02 LAB — PREGNANCY, URINE: Preg Test, Ur: NEGATIVE

## 2018-02-02 NOTE — Progress Notes (Signed)
Wyline Mood MD, MRCP(U.K) [address]  [address]  [place], [place] [address]  Main: [phone]  Fax: [phone]   Primary Care Physician: [facility], [facility]  Primary Gastroenterologist:  Dr. Wyline Mood   No chief complaint on file.   HPI: Natalie Scott is a 27 y.o. female   Summary of history : I had initially seen her on 12/21/2017 when she was referred to see me from the emergency room on her visit of 12/13/2017 for abdominal pain.  The abdominal pain had been ongoing for a year associated with some weight loss.  She had a right upper quadrant ultrasound in April 2018 which was normal.  Labs in 12/13/2017 were negative for pregnancy test, urine analysis, CBC, CMP, lipase normal.  The abdominal pain which is ongoing for a year has been gradually getting worse, at least once a day it occurs lasting from 30 seconds to 1 minute.  Occurs  in the left lower quadrant no radiation and is severe in nature, worse when she eats and occurs right after she eats.  Goes away on its own.  She lost 20 pounds from her baseline of 135-140 pounds.  Appetite has been poor and she says that the pain is preventing her from eating.  Denies any regular NSAID use but does use some occasionally during her..  Has not used a PPI.  Her gallbladder is intact.  Bowel movements are sometimes hard.  Positive gas and bloating.  At her initial visit she consumes 6 cans of soda a day and a lot of sweet tea at least 3 cups a day.  At her initial visit I had suggested she obtain a CAT scan of the abdomen stop all consumption of sweetened soda 70 due to the high fructose consent obtain H. pylori stool antigen, celiac serology commence on a PPI 40 mg a day  Interval history 12/21/2017-02/02/2018  Did not obtain H. pylori stool antigen, did not obtain celiac serology, did not obtain CT scan of the abdomen, did not commence Prilosec 40 mg as suggested.  Consuming 1 can of soda a day , 2 big glasses of  sweet tea a day , gas is less, abdominal pain persists.   Weight stable   BP 103/67 (BP Location: Left Arm, Patient Position: Sitting, Cuff Size: Normal)   Pulse 74   Temp (!) 97.5 F (36.4 C) (Oral)   Ht 5\' 6"  (1.676 m)   Wt 114 lb 3.2 oz (51.8 kg)   Breastfeeding? No   BMI 18.43 kg/m    Current Outpatient Medications  Medication Sig Dispense Refill  . omeprazole (PRILOSEC) 40 MG capsule Take 1 capsule (40 mg total) by mouth daily. 90 capsule 3   No current facility-administered medications for this visit.     Allergies as of 02/02/2018  . (No Known Allergies)    ROS:  General: Negative for anorexia, weight loss, fever, chills, fatigue, weakness. ENT: Negative for hoarseness, difficulty swallowing , nasal congestion. CV: Negative for chest pain, angina, palpitations, dyspnea on exertion, peripheral edema.  Respiratory: Negative for dyspnea at rest, dyspnea on exertion, cough, sputum, wheezing.  GI: See history of present illness. GU:  Negative for dysuria, hematuria, urinary incontinence, urinary frequency, nocturnal urination.  Endo: Negative for unusual weight change.    Physical Examination:   There were no vitals taken for this visit.  General: Well-nourished, well-developed in no acute distress.  Eyes: No icterus. Conjunctivae pink. Mouth: Oropharyngeal mucosa moist and pink ,  no lesions erythema or exudate. Lungs: Clear to auscultation bilaterally. Non-labored. Heart: Regular rate and rhythm, no murmurs rubs or gallops.  Abdomen: Bowel sounds are normal, nontender, nondistended, no hepatosplenomegaly or masses, no abdominal bruits or hernia , no rebound or guarding.   Extremities: No lower extremity edema. No clubbing or deformities. Neuro: Alert and oriented x 3.  Grossly intact. Skin: Warm and dry, no jaundice.   Psych: Alert and cooperative, normal mood and affect.   Imaging Studies: No results found.  Assessment and Plan:   Natalie Scott is a 27 y.o.  y/o female here to follow up for abdominal pain , weight loss , she also has a lot of gas, abdominal distension. Consumes atleast 8 cans of sweetened sodas/tea in a day which is a lot of fructose and may cause her bloating. Since her last visit- did not take her PPI or get the tests ordered or stop the soda consumption. Hence unlikely to feel any better. She does admit to constipation   Plan  1. Stool H pylori antigen and celiac serology  2. Stop al the sodas consumption which can cause her gas 3.  Prilosec 40 mg daily  4. Ct scan of the abdomen and pelvis to evaluate weight loss and abdominal pain -pregnancy test prior to scan needed  5. Trial of linzess 72 mcg a day - 2 week samples provided.  6. If no better at next visit will consider EGD   Dr Wyline MoodKiran Desmund Elman  MD,MRCP [facility](U.K) Follow up in 8 weeks

## 2018-02-05 ENCOUNTER — Encounter: Payer: Self-pay | Admitting: Gastroenterology

## 2018-02-05 LAB — GLIA (IGA/G) + TTG IGA
Antigliadin Abs, IgA: 4 units (ref 0–19)
Gliadin IgG: 4 units (ref 0–19)
Tissue Transglutaminase Ab, IgA: <2 U/mL (ref 0–3)

## 2018-02-06 ENCOUNTER — Telehealth: Payer: Self-pay

## 2018-02-06 NOTE — Telephone Encounter (Signed)
Advised patient of results per Dr. Tobi BastosAnna.    - Celiac serology negative  Patient still scheduled for CT. States Linzess trial is working well.

## 2018-02-21 ENCOUNTER — Ambulatory Visit: Payer: Medicaid Other

## 2018-03-06 ENCOUNTER — Telehealth: Payer: Self-pay

## 2018-03-06 ENCOUNTER — Other Ambulatory Visit: Payer: Self-pay

## 2018-03-06 DIAGNOSIS — R1013 Epigastric pain: Secondary | ICD-10-CM

## 2018-03-06 DIAGNOSIS — R634 Abnormal weight loss: Secondary | ICD-10-CM

## 2018-03-06 NOTE — Telephone Encounter (Signed)
LVM for patient callback.   Due to insurance denials CT scan was changed to US.   Rescheduled for 3/14 @ 1015am @ [facility].   NPO: Midnight.

## 2018-03-06 NOTE — Progress Notes (Signed)
us

## 2018-03-07 ENCOUNTER — Encounter: Payer: Self-pay | Admitting: Obstetrics and Gynecology

## 2018-03-07 ENCOUNTER — Other Ambulatory Visit: Payer: Self-pay | Admitting: Obstetrics and Gynecology

## 2018-03-07 ENCOUNTER — Ambulatory Visit (INDEPENDENT_AMBULATORY_CARE_PROVIDER_SITE_OTHER): Payer: Medicaid Other | Admitting: Obstetrics and Gynecology

## 2018-03-07 VITALS — BP 117/73 | HR 100 | Ht 66.0 in | Wt 118.0 lb

## 2018-03-07 DIAGNOSIS — Z01411 Encounter for gynecological examination (general) (routine) with abnormal findings: Secondary | ICD-10-CM

## 2018-03-07 DIAGNOSIS — Z0001 Encounter for general adult medical examination with abnormal findings: Secondary | ICD-10-CM | POA: Diagnosis not present

## 2018-03-07 DIAGNOSIS — F419 Anxiety disorder, unspecified: Secondary | ICD-10-CM

## 2018-03-07 DIAGNOSIS — Z308 Encounter for other contraceptive management: Secondary | ICD-10-CM | POA: Diagnosis not present

## 2018-03-07 MED ORDER — FLUOXETINE HCL 10 MG PO CAPS: 10.0000 mg | ORAL_CAPSULE | Freq: Every day | ORAL | 3 refills | Status: DC

## 2018-03-07 MED ORDER — MEDROXYPROGESTERONE ACETATE 150 MG/ML IM SUSP: 150.0000 mg | INTRAMUSCULAR | 4 refills | Status: DC

## 2018-03-07 NOTE — Progress Notes (Signed)
  Subjective:     Natalie Scott is a single black female 27 y.o. female and is here for a comprehensive physical exam. The patient reports inceased vaginal discharge, especially before menses. also worsening anxiety since going back to work. desires medication as itis affecting her work and she feels easily aggitated with kids. is a single mom.  lastly, she desires birth control. .  Social History   Socioeconomic History  . Marital status: Single    Spouse name: Not on file  . Number of children: Not on file  . Years of education: Not on file  . Highest education level: Not on file  Social Needs  . Financial resource strain: Not on file  . Food insecurity - worry: Not on file  . Food insecurity - inability: Not on file  . Transportation needs - medical: Not on file  . Transportation needs - non-medical: Not on file  Occupational History  . Not on file  Tobacco Use  . Smoking status: Never Smoker  . Smokeless tobacco: Never Used  Substance and Sexual Activity  . Alcohol use: No    Alcohol/week: 0.0 oz  . Drug use: Yes    Types: Marijuana    Comment: not since pregnant  . Sexual activity: Yes    Partners: Male  Other Topics Concern  . Not on file  Social History Narrative  . Not on file   Health Maintenance  Topic Date Due  . PAP SMEAR  05/23/2010  . INFLUENZA VACCINE  07/27/2017  . TETANUS/TDAP  09/14/2026  . HIV Screening  Completed    The following portions of the patient's history were reviewed and updated as appropriate: allergies, current medications, past family history, past medical history, past social history, past surgical history and problem list.  Review of Systems Pertinent items noted in HPI and remainder of comprehensive ROS otherwise negative.   Objective:    General appearance: alert, cooperative and appears stated age Neck: mild anterior cervical adenopathy, no adenopathy, no carotid bruit, no JVD, supple, symmetrical, trachea midline and thyroid  not enlarged, symmetric, no tenderness/mass/nodules Lungs: clear to auscultation bilaterally Breasts: normal appearance, no masses or tenderness Heart: regular rate and rhythm, S1, S2 normal, no murmur, click, rub or gallop Abdomen: soft, non-tender; bowel sounds normal; no masses,  no organomegaly Pelvic: cervix normal in appearance, external genitalia normal, no adnexal masses or tenderness, no cervical motion tenderness, rectovaginal septum normal, uterus normal size, shape, and consistency, vagina normal without discharge and wetprep negative      Assessment:    Healthy female exam. Anxiety, BC restart     Plan:  Counseled at length regarding anxiety and treatment options. Has tried zoloft and it made her very sedated and depressed. will try prozac 10 mg daily.  Also will restart Depo 150mg  q12 weeks-will have cousin that is a nurse give it to her with start at next menses. RTC 1 year or as needed.  Kwasi Joung,CNM    See After Visit Summary for Counseling Recommendations

## 2018-03-07 NOTE — Patient Instructions (Addendum)
Place annual gynecologic exam patient instructions here.  Living With Anxiety After being diagnosed with an anxiety disorder, you may be relieved to know why you have felt or behaved a certain way. It is natural to also feel overwhelmed about the treatment ahead and what it will mean for your life. With care and support, you can manage this condition and recover from it. How to cope with anxiety Dealing with stress Stress is your body's reaction to life changes and events, both good and bad. Stress can last just a few hours or it can be ongoing. Stress can play a major role in anxiety, so it is important to learn both how to cope with stress and how to think about it differently. Talk with your health care provider or a counselor to learn more about stress reduction. He or she may suggest some stress reduction techniques, such as:  Music therapy. This can include creating or listening to music that you enjoy and that inspires you.  Mindfulness-based meditation. This involves being aware of your normal breaths, rather than trying to control your breathing. It can be done while sitting or walking.  Centering prayer. This is a kind of meditation that involves focusing on a word, phrase, or sacred image that is meaningful to you and that brings you peace.  Deep breathing. To do this, expand your stomach and inhale slowly through your nose. Hold your breath for 3-5 seconds. Then exhale slowly, allowing your stomach muscles to relax.  Self-talk. This is a skill where you identify thought patterns that lead to anxiety reactions and correct those thoughts.  Muscle relaxation. This involves tensing muscles then relaxing them.  Choose a stress reduction technique that fits your lifestyle and personality. Stress reduction techniques take time and practice. Set aside 5-15 minutes a day to do them. Therapists can offer training in these techniques. The training may be covered by some insurance plans. Other  things you can do to manage stress include:  Keeping a stress diary. This can help you learn what triggers your stress and ways to control your response.  Thinking about how you respond to certain situations. You may not be able to control everything, but you can control your reaction.  Making time for activities that help you relax, and not feeling guilty about spending your time in this way.  Therapy combined with coping and stress-reduction skills provides the best chance for successful treatment. Medicines Medicines can help ease symptoms. Medicines for anxiety include:  Anti-anxiety drugs.  Antidepressants.  Beta-blockers.  Medicines may be used as the main treatment for anxiety disorder, along with therapy, or if other treatments are not working. Medicines should be prescribed by a health care provider. Relationships Relationships can play a big part in helping you recover. Try to spend more time connecting with trusted friends and family members. Consider going to couples counseling, taking family education classes, or going to family therapy. Therapy can help you and others better understand the condition. How to recognize changes in your condition Everyone has a different response to treatment for anxiety. Recovery from anxiety happens when symptoms decrease and stop interfering with your daily activities at home or work. This may mean that you will start to:  Have better concentration and focus.  Sleep better.  Be less irritable.  Have more energy.  Have improved memory.  It is important to recognize when your condition is getting worse. Contact your health care provider if your symptoms interfere with home or work  and you do not feel like your condition is improving. Where to find help and support: You can get help and support from these sources:  Self-help groups.  Online and [employer]community organizations.  A trusted spiritual leader.  Couples counseling.  Family  education classes.  Family therapy.  Follow these instructions at home:  Eat a healthy diet that includes plenty of vegetables, fruits, whole grains, low-fat dairy products, and lean protein. Do not eat a lot of foods that are high in solid fats, added sugars, or salt.  Exercise. Most adults should do the following: ? Exercise for at least 150 minutes each week. The exercise should increase your heart rate and make you sweat (moderate-intensity exercise). ? Strengthening exercises at least twice a week.  Cut down on caffeine, tobacco, alcohol, and other potentially harmful substances.  Get the right amount and quality of sleep. Most adults need 7-9 hours of sleep each night.  Make choices that simplify your life.  Take over-the-counter and prescription medicines only as told by your health care provider.  Avoid caffeine, alcohol, and certain over-the-counter cold medicines. These may make you feel worse. Ask your pharmacist which medicines to avoid.  Keep all follow-up visits as told by your health care provider. This is important. Questions to ask your health care provider  Would I benefit from therapy?  How often should I follow up with a health care provider?  How long do I need to take medicine?  Are there any long-term side effects of my medicine?  Are there any alternatives to taking medicine? Contact a health care provider if:  You have a hard time staying focused or finishing daily tasks.  You spend many hours a day feeling worried about everyday life.  You become exhausted by worry.  You start to have headaches, feel tense, or have nausea.  You urinate more than normal.  You have diarrhea. Get help right away if:  You have a racing heart and shortness of breath.  You have thoughts of hurting yourself or others. If you ever feel like you may hurt yourself or others, or have thoughts about taking your own life, get help right away. You can go to your nearest  emergency department or call:  Your local emergency services (911 in the [place]  A suicide crisis helpline, such as the National Suicide Prevention Lifeline at (682) 343-00961-838 554 2253. This is open 24-hours a day.  Summary  Taking steps to deal with stress can help calm you.  Medicines cannot cure anxiety disorders, but they can help ease symptoms.  Family, friends, and partners can play a big part in helping you recover from an anxiety disorder. This information is not intended to replace advice given to you by your health care provider. Make sure you discuss any questions you have with your health care provider. Document Released: 12/07/2016 Document Revised: 12/07/2016 Document Reviewed: 12/07/2016 Elsevier Interactive Patient Education  [employer]2018 Elsevier Inc.

## 2018-03-08 LAB — CYTOLOGY - PAP

## 2018-03-09 ENCOUNTER — Encounter: Payer: Self-pay | Admitting: Gastroenterology

## 2018-03-09 ENCOUNTER — Ambulatory Visit: Payer: Medicaid Other

## 2018-03-09 ENCOUNTER — Telehealth: Payer: Self-pay | Admitting: Gastroenterology

## 2018-03-09 NOTE — Telephone Encounter (Signed)
Stephanie from [facility] department calling to inform us pt had appointment for Abdomin u/s today and did not show up

## 2018-03-09 NOTE — Telephone Encounter (Signed)
Can we send her a letter please  Natalie Scott

## 2018-03-13 ENCOUNTER — Ambulatory Visit: Payer: Medicaid Other | Admitting: Gastroenterology

## 2018-03-20 ENCOUNTER — Encounter: Payer: Self-pay | Admitting: Obstetrics and Gynecology

## 2018-03-20 ENCOUNTER — Ambulatory Visit (INDEPENDENT_AMBULATORY_CARE_PROVIDER_SITE_OTHER): Payer: Medicaid Other | Admitting: Obstetrics and Gynecology

## 2018-03-20 VITALS — BP 110/68 | HR 77 | Wt 117.0 lb

## 2018-03-20 DIAGNOSIS — Z3042 Encounter for surveillance of injectable contraceptive: Secondary | ICD-10-CM

## 2018-03-20 DIAGNOSIS — Z308 Encounter for other contraceptive management: Secondary | ICD-10-CM

## 2018-03-20 MED ORDER — MEDROXYPROGESTERONE ACETATE 150 MG/ML IM SUSP
150.0000 mg | Freq: Once | INTRAMUSCULAR | Status: AC
  Administered 2018-03-20: 150 mg via INTRAMUSCULAR

## 2018-03-24 ENCOUNTER — Encounter: Payer: Self-pay | Admitting: Obstetrics and Gynecology

## 2018-03-24 ENCOUNTER — Other Ambulatory Visit: Payer: Self-pay | Admitting: *Deleted

## 2018-03-24 MED ORDER — METRONIDAZOLE 500 MG PO TABS: 500.0000 mg | ORAL_TABLET | Freq: Two times a day (BID) | ORAL | 0 refills | Status: DC

## 2018-06-02 NOTE — Progress Notes (Signed)
Here for depo injection only

## 2018-06-05 ENCOUNTER — Encounter: Payer: Medicaid Other | Admitting: Obstetrics and Gynecology

## 2019-08-24 ENCOUNTER — Encounter: Payer: Self-pay | Admitting: Obstetrics and Gynecology

## 2019-09-06 ENCOUNTER — Encounter: Payer: Self-pay | Admitting: Obstetrics and Gynecology

## 2019-09-20 ENCOUNTER — Encounter: Payer: Self-pay | Admitting: Obstetrics and Gynecology

## 2019-09-20 ENCOUNTER — Other Ambulatory Visit (HOSPITAL_COMMUNITY)
Admission: RE | Admit: 2019-09-20 | Discharge: 2019-09-20 | Disposition: A | Discharge status: Home / Self Care | Payer: Medicaid Other | Source: Ambulatory Visit | Attending: Obstetrics and Gynecology | Admitting: Obstetrics and Gynecology

## 2019-09-20 ENCOUNTER — Ambulatory Visit (INDEPENDENT_AMBULATORY_CARE_PROVIDER_SITE_OTHER): Payer: Medicaid Other | Admitting: Obstetrics and Gynecology

## 2019-09-20 ENCOUNTER — Other Ambulatory Visit: Payer: Self-pay

## 2019-09-20 VITALS — BP 105/71 | HR 84 | Ht 66.0 in | Wt 131.1 lb

## 2019-09-20 DIAGNOSIS — N921 Excessive and frequent menstruation with irregular cycle: Secondary | ICD-10-CM | POA: Diagnosis not present

## 2019-09-20 DIAGNOSIS — F419 Anxiety disorder, unspecified: Secondary | ICD-10-CM

## 2019-09-20 DIAGNOSIS — Z01419 Encounter for gynecological examination (general) (routine) without abnormal findings: Secondary | ICD-10-CM | POA: Insufficient documentation

## 2019-09-20 DIAGNOSIS — Z0000 Encounter for general adult medical examination without abnormal findings: Secondary | ICD-10-CM

## 2019-09-20 DIAGNOSIS — N946 Dysmenorrhea, unspecified: Secondary | ICD-10-CM | POA: Diagnosis not present

## 2019-09-20 DIAGNOSIS — Z808 Family history of malignant neoplasm of other organs or systems: Secondary | ICD-10-CM

## 2019-09-20 MED ORDER — LEVONORGEST-ETH ESTRAD 91-DAY 0.15-0.03 &0.01 MG PO TABS: 1.0000 | ORAL_TABLET | Freq: Every day | ORAL | 4 refills | Status: DC

## 2019-09-20 NOTE — Patient Instructions (Signed)
 Preventive Care 21-30 Years Old, Female Preventive care refers to visits with your health care provider and lifestyle choices that can promote health and wellness. This includes:  A yearly physical exam. This may also be called an annual well check.  Regular dental visits and eye exams.  Immunizations.  Screening for certain conditions.  Healthy lifestyle choices, such as eating a healthy diet, getting regular exercise, not using drugs or products that contain nicotine and tobacco, and limiting alcohol use. What can I expect for my preventive care visit? Physical exam Your health care provider will check your:  Height and weight. This may be used to calculate body mass index (BMI), which tells if you are at a healthy weight.  Heart rate and blood pressure.  Skin for abnormal spots. Counseling Your health care provider may ask you questions about your:  Alcohol, tobacco, and drug use.  Emotional well-being.  Home and relationship well-being.  Sexual activity.  Eating habits.  Work and work environment.  Method of birth control.  Menstrual cycle.  Pregnancy history. What immunizations do I need?  Influenza (flu) vaccine  This is recommended every year. Tetanus, diphtheria, and pertussis (Tdap) vaccine  You may need a Td booster every 10 years. Varicella (chickenpox) vaccine  You may need this if you have not been vaccinated. Human papillomavirus (HPV) vaccine  If recommended by your health care provider, you may need three doses over 6 months. Measles, mumps, and rubella (MMR) vaccine  You may need at least one dose of MMR. You may also need a second dose. Meningococcal conjugate (MenACWY) vaccine  One dose is recommended if you are age 19-21 years and a first-year college student living in a residence hall, or if you have one of several medical conditions. You may also need additional booster doses. Pneumococcal conjugate (PCV13) vaccine  You may need  this if you have certain conditions and were not previously vaccinated. Pneumococcal polysaccharide (PPSV23) vaccine  You may need one or two doses if you smoke cigarettes or if you have certain conditions. Hepatitis A vaccine  You may need this if you have certain conditions or if you travel or work in places where you may be exposed to hepatitis A. Hepatitis B vaccine  You may need this if you have certain conditions or if you travel or work in places where you may be exposed to hepatitis B. Haemophilus influenzae type b (Hib) vaccine  You may need this if you have certain conditions. You may receive vaccines as individual doses or as more than one vaccine together in one shot (combination vaccines). Talk with your health care provider about the risks and benefits of combination vaccines. What tests do I need?  Blood tests  Lipid and cholesterol levels. These may be checked every 5 years starting at age 20.  Hepatitis C test.  Hepatitis B test. Screening  Diabetes screening. This is done by checking your blood sugar (glucose) after you have not eaten for a while (fasting).  Sexually transmitted disease (STD) testing.  BRCA-related cancer screening. This may be done if you have a family history of breast, ovarian, tubal, or peritoneal cancers.  Pelvic exam and Pap test. This may be done every 3 years starting at age 21. Starting at age 30, this may be done every 5 years if you have a Pap test in combination with an HPV test. Talk with your health care provider about your test results, treatment options, and if necessary, the need for more   tests. Follow these instructions at home: Eating and drinking   Eat a diet that includes fresh fruits and vegetables, whole grains, lean protein, and low-fat dairy.  Take vitamin and mineral supplements as recommended by your health care provider.  Do not drink alcohol if: ? Your health care provider tells you not to drink. ? You are  pregnant, may be pregnant, or are planning to become pregnant.  If you drink alcohol: ? Limit how much you have to 0-1 drink a day. ? Be aware of how much alcohol is in your drink. In the [place] one drink equals one 12 oz bottle of beer (355 mL), one 5 oz glass of wine (148 mL), or one 1 oz glass of hard liquor (44 mL). Lifestyle  Take daily care of your teeth and gums.  Stay active. Exercise for at least 30 minutes on 5 or more days each week.  Do not use any products that contain nicotine or tobacco, such as cigarettes, e-cigarettes, and chewing tobacco. If you need help quitting, ask your health care provider.  If you are sexually active, practice safe sex. Use a condom or other form of birth control (contraception) in order to prevent pregnancy and STIs (sexually transmitted infections). If you plan to become pregnant, see your health care provider for a preconception visit. What's next?  Visit your health care provider once a year for a well check visit.  Ask your health care provider how often you should have your eyes and teeth checked.  Stay up to date on all vaccines. This information is not intended to replace advice given to you by your health care provider. Make sure you discuss any questions you have with your health care provider. Document Released: 02/08/2002 Document Revised: 08/24/2018 Document Reviewed: 08/24/2018 Elsevier Patient Education  2020 [employer].

## 2019-09-20 NOTE — Progress Notes (Signed)
Subjective:   Natalie Scott is a 28 y.o. G98P1001 African American female here for a routine well-woman exam.  Patient's last menstrual period was 09/04/2019.    Current complaints: lower pelvic pains, worse around menses. Menses are heavy with clots and last 5-6 days.  Stopped depo a year ago. Reports last month had normal period for 1 weeks then stopped for one week and then restarted and lasted for 3 weeks. Does report pain with sex and occasional bleeding afterwards. Denies fever or urinary changes or bowel changes. Ibuprofen helps.  PCP: none       does desire labs  Social History: Sexual: heterosexual Marital Status: single Living situation: with family Occupation: Chemical engineer at [employer] Tobacco/alcohol: no tobacco use Illicit drugs: frequent smoked marijuana  The following portions of the patient's history were reviewed and updated as appropriate: allergies, current medications, past family history, past medical history, past social history, past surgical history and problem list.  Past Medical History Past Medical History:  Diagnosis Date  . Anemia   . Eczema   . Headache   . History of anemia   . Hx: UTI (urinary tract infection)     Past Surgical History History reviewed. No pertinent surgical history.  Gynecologic History G2P1001  Patient's last menstrual period was 09/04/2019. Contraception: coitus interruptus Last Pap: 2019. Results were: normal   Obstetric History OB History  Gravida Para Term Preterm AB Living  2 1 1  0 0 1  SAB TAB Ectopic Multiple Live Births  0 0 0 0 1    # Outcome Date GA Lbr Len/2nd Weight Sex Delivery Anes PTL Lv  2 Gravida           1 Term 01/09/11 [redacted]w[redacted]d  8 lb (3.629 kg) F Vag-Spont  N LIV    Current Medications No current outpatient medications on file prior to visit.   No current facility-administered medications on file prior to visit.     Review of Systems Patient denies any headaches, blurred vision, shortness  of breath, chest pain, abdominal pain, problems with bowel movements, urination, or intercourse.  Objective:  BP 105/71   Pulse 84   Ht 5\' 6"  (1.676 m)   Wt 131 lb 1.6 oz (59.5 kg)   LMP 09/04/2019   BMI 21.16 kg/m  Physical Exam  General:  Well developed, well nourished, no acute distress. She is alert and oriented x3. Skin:  Warm and dry Neck:  Midline trachea, no thyromegaly or nodules Cardiovascular: Regular rate and rhythm, no murmur heard Lungs:  Effort normal, all lung fields clear to auscultation bilaterally Breasts:  No dominant palpable mass, retraction, or nipple discharge Abdomen:  Soft, non tender, no hepatosplenomegaly or masses Pelvic:  External genitalia is normal in appearance.  The vagina is normal in appearance. The cervix is bulbous, no CMT.  Thin prep pap is not done . Uterus is felt to be normal size, shape, and contour.  No adnexal masses or tenderness noted. Extremities:  No swelling or varicosities noted Psych:  She has a normal mood and affect  Assessment:   Healthy well-woman exam Irregular painful menses Family h/o bone cancer in mother Pelvic pain  Plan:  Labs obtained, will follow up accordingly, included cancer gene panel Declined flu vaccine Will try seasonique to regulate menses and suspected ovarian cyst- and will follow up in 6 weeks with u/s. F/U 1 year for AE, or sooner if needed   Melody Rockney Ghee, CNM

## 2019-09-21 ENCOUNTER — Other Ambulatory Visit: Payer: Self-pay | Admitting: Obstetrics and Gynecology

## 2019-09-21 DIAGNOSIS — E559 Vitamin D deficiency, unspecified: Secondary | ICD-10-CM

## 2019-09-21 LAB — COMPREHENSIVE METABOLIC PANEL
ALT: 14 IU/L (ref 0–32)
AST: 18 IU/L (ref 0–40)
Albumin/Globulin Ratio: 1.7 (ref 1.2–2.2)
Albumin: 4.7 g/dL (ref 3.9–5.0)
Alkaline Phosphatase: 50 IU/L (ref 39–117)
BUN/Creatinine Ratio: 12 (ref 9–23)
BUN: 10 mg/dL (ref 6–20)
Bilirubin Total: 0.5 mg/dL (ref 0.0–1.2)
CO2: 22 mmol/L (ref 20–29)
Calcium: 9.7 mg/dL (ref 8.7–10.2)
Chloride: 103 mmol/L (ref 96–106)
Creatinine, Ser: 0.86 mg/dL (ref 0.57–1.00)
GFR calc Af Amer: 105 mL/min/{1.73_m2} (ref >59)
GFR calc non Af Amer: 91 mL/min/{1.73_m2} (ref >59)
Globulin, Total: 2.8 g/dL (ref 1.5–4.5)
Glucose: 105 mg/dL — ABNORMAL HIGH (ref 65–99)
Potassium: 4.5 mmol/L (ref 3.5–5.2)
Sodium: 138 mmol/L (ref 134–144)
Total Protein: 7.5 g/dL (ref 6.0–8.5)

## 2019-09-21 LAB — CBC
Hematocrit: 38.3 % (ref 34.0–46.6)
Hemoglobin: 12.4 g/dL (ref 11.1–15.9)
MCH: 28.4 pg (ref 26.6–33.0)
MCHC: 32.4 g/dL (ref 31.5–35.7)
MCV: 88 fL (ref 79–97)
Platelets: 283 10*3/uL (ref 150–450)
RBC: 4.36 x10E6/uL (ref 3.77–5.28)
RDW: 12.2 % (ref 11.7–15.4)
WBC: 8.9 10*3/uL (ref 3.4–10.8)

## 2019-09-21 LAB — B12 AND FOLATE PANEL
Folate: 7.9 ng/mL (ref >3.0)
Vitamin B-12: 579 pg/mL (ref 232–1245)

## 2019-09-21 LAB — FERRITIN: Ferritin: 10 ng/mL — ABNORMAL LOW (ref 15–150)

## 2019-09-21 LAB — THYROID PANEL WITH TSH
Free Thyroxine Index: 2.4 (ref 1.2–4.9)
T3 Uptake Ratio: 29 % (ref 24–39)
T4, Total: 8.2 ug/dL (ref 4.5–12.0)
TSH: 0.834 u[IU]/mL (ref 0.450–4.500)

## 2019-09-21 LAB — VITAMIN D 25 HYDROXY (VIT D DEFICIENCY, FRACTURES): Vit D, 25-Hydroxy: 13.5 ng/mL — ABNORMAL LOW (ref 30.0–100.0)

## 2019-09-21 MED ORDER — VITAMIN D (ERGOCALCIFEROL) 1.25 MG (50000 UNIT) PO CAPS: 50000.0000 [IU] | ORAL_CAPSULE | ORAL | 2 refills | Status: DC

## 2019-10-01 LAB — CYTOLOGY - PAP
Chlamydia: NEGATIVE
HPV 16: POSITIVE — AB
HPV 18 / 45: NEGATIVE
High risk HPV: POSITIVE — AB
Neisseria Gonorrhea: NEGATIVE

## 2019-10-05 ENCOUNTER — Telehealth: Payer: Self-pay

## 2019-10-05 NOTE — Telephone Encounter (Signed)
Spoke with patient regarding last mychart message. Explained HPV to her and answered all questions. Information mailed to patient.

## 2019-10-18 ENCOUNTER — Ambulatory Visit (INDEPENDENT_AMBULATORY_CARE_PROVIDER_SITE_OTHER): Payer: Medicaid Other | Admitting: Obstetrics and Gynecology

## 2019-10-18 ENCOUNTER — Other Ambulatory Visit: Payer: Self-pay

## 2019-10-18 ENCOUNTER — Encounter: Payer: Self-pay | Admitting: Obstetrics and Gynecology

## 2019-10-18 VITALS — BP 104/53 | HR 76 | Ht 66.0 in | Wt 127.4 lb

## 2019-10-18 DIAGNOSIS — N912 Amenorrhea, unspecified: Secondary | ICD-10-CM | POA: Diagnosis not present

## 2019-10-18 DIAGNOSIS — E559 Vitamin D deficiency, unspecified: Secondary | ICD-10-CM

## 2019-10-18 LAB — POCT URINE PREGNANCY: Preg Test, Ur: POSITIVE — AB

## 2019-10-18 NOTE — Patient Instructions (Signed)
First Trimester of Pregnancy ° °The first trimester of pregnancy is from week 1 until the end of week 13 (months 1 through 3). During this time, your baby will begin to develop inside you. At 6-8 weeks, the eyes and face are formed, and the heartbeat can be seen on ultrasound. At the end of 12 weeks, all the baby's organs are formed. Prenatal care is all the medical care you receive before the birth of your baby. Make sure you get good prenatal care and follow all of your doctor's instructions. °Follow these instructions at home: °Medicines °· Take over-the-counter and prescription medicines only as told by your doctor. Some medicines are safe and some medicines are not safe during pregnancy. °· Take a prenatal vitamin that contains at least 600 micrograms (mcg) of folic acid. °· If you have trouble pooping (constipation), take medicine that will make your stool soft (stool softener) if your doctor approves. °Eating and drinking ° °· Eat regular, healthy meals. °· Your doctor will tell you the amount of weight gain that is right for you. °· Avoid raw meat and uncooked cheese. °· If you feel sick to your stomach (nauseous) or throw up (vomit): °? Eat 4 or 5 small meals a day instead of 3 large meals. °? Try eating a few soda crackers. °? Drink liquids between meals instead of during meals. °· To prevent constipation: °? Eat foods that are high in fiber, like fresh fruits and vegetables, whole grains, and beans. °? Drink enough fluids to keep your pee (urine) clear or pale yellow. °Activity °· Exercise only as told by your doctor. Stop exercising if you have cramps or pain in your lower belly (abdomen) or low back. °· Do not exercise if it is too hot, too humid, or if you are in a place of great height (high altitude). °· Try to avoid standing for long periods of time. Move your legs often if you must stand in one place for a long time. °· Avoid heavy lifting. °· Wear low-heeled shoes. Sit and stand up  straight. °· You can have sex unless your doctor tells you not to. °Relieving pain and discomfort °· Wear a good support bra if your breasts are sore. °· Take warm water baths (sitz baths) to soothe pain or discomfort caused by hemorrhoids. Use hemorrhoid cream if your doctor says it is okay. °· Rest with your legs raised if you have leg cramps or low back pain. °· If you have puffy, bulging veins (varicose veins) in your legs: °? Wear support hose or compression stockings as told by your doctor. °? Raise (elevate) your feet for 15 minutes, 3-4 times a day. °? Limit salt in your food. °Prenatal care °· Schedule your prenatal visits by the twelfth week of pregnancy. °· Write down your questions. Take them to your prenatal visits. °· Keep all your prenatal visits as told by your doctor. This is important. °Safety °· Wear your seat belt at all times when driving. °· Make a list of emergency phone numbers. The list should include numbers for family, friends, the hospital, and police and fire departments. °General instructions °· Ask your doctor for a referral to a local prenatal class. Begin classes no later than at the start of month 6 of your pregnancy. °· Ask for help if you need counseling or if you need help with nutrition. Your doctor can give you advice or tell you where to go for help. °· Do not use hot tubs, steam   rooms, or saunas. °· Do not douche or use tampons or scented sanitary pads. °· Do not cross your legs for long periods of time. °· Avoid all herbs and alcohol. Avoid drugs that are not approved by your doctor. °· Do not use any tobacco products, including cigarettes, chewing tobacco, and electronic cigarettes. If you need help quitting, ask your doctor. You may get counseling or other support to help you quit. °· Avoid cat litter boxes and soil used by cats. These carry germs that can cause birth defects in the baby and can cause a loss of your baby (miscarriage) or stillbirth. °· Visit your dentist.  At home, brush your teeth with a soft toothbrush. Be gentle when you floss. °Contact a doctor if: °· You are dizzy. °· You have mild cramps or pressure in your lower belly. °· You have a nagging pain in your belly area. °· You continue to feel sick to your stomach, you throw up, or you have watery poop (diarrhea). °· You have a bad smelling fluid coming from your vagina. °· You have pain when you pee (urinate). °· You have increased puffiness (swelling) in your face, hands, legs, or ankles. °Get help right away if: °· You have a fever. °· You are leaking fluid from your vagina. °· You have spotting or bleeding from your vagina. °· You have very bad belly cramping or pain. °· You gain or lose weight rapidly. °· You throw up blood. It may look like coffee grounds. °· You are around people who have German measles, fifth disease, or chickenpox. °· You have a very bad headache. °· You have shortness of breath. °· You have any kind of trauma, such as from a fall or a car accident. °Summary °· The first trimester of pregnancy is from week 1 until the end of week 13 (months 1 through 3). °· To take care of yourself and your unborn baby, you will need to eat healthy meals, take medicines only if your doctor tells you to do so, and do activities that are safe for you and your baby. °· Keep all follow-up visits as told by your doctor. This is important as your doctor will have to ensure that your baby is healthy and growing well. °This information is not intended to replace advice given to you by your health care provider. Make sure you discuss any questions you have with your health care provider. °Document Released: 05/31/2008 Document Revised: 04/05/2019 Document Reviewed: 12/21/2016 °Elsevier Patient Education © 2020 [employer] ° °

## 2019-10-18 NOTE — Progress Notes (Signed)
Patient is here for a confirmation of pregnancy. LMP 09/04/19 6 weeks 2days EDD 06/10/20.

## 2019-10-18 NOTE — Progress Notes (Signed)
  Subjective:     Patient ID: Natalie Scott, female   DOB: October 29, 1991, 28 y.o.   MRN: [id number]  HPI Here for pregnancy confirmation, reporting normal LMP 09/04/2019, which give EDD 06/10/2020 & EGA [redacted]w[redacted]d. Patient states she is feeling fine other than fatigue.she has 2 daughters already, but this will be first child with current partner.   Review of Systems  Constitutional: Positive for fatigue.  All other systems reviewed and are negative.      Objective:   Physical Exam A&OX4 Well groomed female in no distress Vitals with BMI 10/18/2019 09/20/2019 03/20/2018  Height 5\' 6"  5\' 6"  -  Weight 127 lbs 6 oz 131 lbs 2 oz 117 lbs  BMI 20.57 94.76 54.65  Systolic 035 465 681  Diastolic 53 71 68  Pulse 76 84 77  Some encounter information is confidential and restricted. Go to Review Flowsheets activity to see all data.  UPT+ PE not indicated    Assessment:     Missed menses    Plan:     Congratulated on pregnancy. Reviewed routine prenatal care. RTC in 2 weeks for viability scan, nurse intake with Labs (will recheck vit D also). Then 6 weeks for new OB PE. PNV samples given.   Melody Shambley,CNM

## 2019-10-25 NOTE — Telephone Encounter (Signed)
Pt returned call from [facility]. Per pt req please call pt again.

## 2019-11-01 ENCOUNTER — Other Ambulatory Visit: Payer: Medicaid Other

## 2019-11-16 ENCOUNTER — Encounter: Payer: Medicaid Other | Admitting: Obstetrics and Gynecology

## 2019-11-26 ENCOUNTER — Telehealth: Payer: Medicaid Other | Admitting: Physician Assistant

## 2019-11-26 DIAGNOSIS — N760 Acute vaginitis: Secondary | ICD-10-CM | POA: Diagnosis not present

## 2019-11-26 DIAGNOSIS — B9689 Other specified bacterial agents as the cause of diseases classified elsewhere: Secondary | ICD-10-CM

## 2019-11-26 MED ORDER — METRONIDAZOLE 500 MG PO TABS: 500.0000 mg | ORAL_TABLET | Freq: Two times a day (BID) | ORAL | 0 refills | Status: AC

## 2019-11-26 NOTE — Progress Notes (Signed)

## 2019-11-28 ENCOUNTER — Encounter: Payer: Medicaid Other | Admitting: Obstetrics and Gynecology

## 2019-11-30 ENCOUNTER — Encounter: Payer: Medicaid Other | Admitting: Obstetrics and Gynecology

## 2021-03-30 ENCOUNTER — Encounter: Payer: Self-pay | Admitting: Obstetrics and Gynecology

## 2021-03-30 ENCOUNTER — Ambulatory Visit (INDEPENDENT_AMBULATORY_CARE_PROVIDER_SITE_OTHER): Payer: Medicaid Other | Admitting: Obstetrics and Gynecology

## 2021-03-30 ENCOUNTER — Other Ambulatory Visit: Payer: Self-pay

## 2021-03-30 VITALS — BP 115/80 | HR 88 | Ht 66.0 in | Wt 134.4 lb

## 2021-03-30 DIAGNOSIS — N938 Other specified abnormal uterine and vaginal bleeding: Secondary | ICD-10-CM

## 2021-03-30 NOTE — Progress Notes (Signed)
HPI:      [name] is a 30 y.o. [id number] who LMP was Patient's last menstrual period was 03/03/2021.  Subjective:   She presents today stating that she has had 2 cycles in the last month.  She had some concern regarding this unusual menses. She is currently sexually active but not using birth control other than "withdrawal method".  She does not want to be pregnant at this time and would consider birth control methods.    Hx: The following portions of the patient's history were reviewed and updated as appropriate:             She  has a past medical history of Anemia, Eczema, Headache, History of anemia, and UTI (urinary tract infection). She does not have any pertinent problems on file. She  has no past surgical history on file. Her family history includes Asthma in her brother and daughter; Bone cancer in her mother; Breast cancer (age of onset: 41) in her maternal aunt; Diabetes in an other family member; Lupus in her mother; Osteoarthritis in her paternal grandmother. She  reports that she has never smoked. She has never used smokeless tobacco. She reports current drug use. Drug: Marijuana. She reports that she does not drink alcohol. She currently has no medications in their medication list. She has No Known Allergies.       Review of Systems:  Review of Systems  Constitutional: Denied constitutional symptoms, night sweats, recent illness, fatigue, fever, insomnia and weight loss.  Eyes: Denied eye symptoms, eye pain, photophobia, vision change and visual disturbance.  Ears/Nose/Throat/Neck: Denied ear, nose, throat or neck symptoms, hearing loss, nasal discharge, sinus congestion and sore throat.  Cardiovascular: Denied cardiovascular symptoms, arrhythmia, chest pain/pressure, edema, exercise intolerance, orthopnea and palpitations.  Respiratory: Denied pulmonary symptoms, asthma, pleuritic pain, productive sputum, cough, dyspnea and wheezing.  Gastrointestinal: Denied,  gastro-esophageal reflux, melena, nausea and vomiting.  Genitourinary: See HPI for additional information.  Musculoskeletal: Denied musculoskeletal symptoms, stiffness, swelling, muscle weakness and myalgia.  Dermatologic: Denied dermatology symptoms, rash and scar.  Neurologic: Denied neurology symptoms, dizziness, headache, neck pain and syncope.  Psychiatric: Denied psychiatric symptoms, anxiety and depression.  Endocrine: Denied endocrine symptoms including hot flashes and night sweats.   Meds:   No current outpatient medications on file prior to visit.   No current facility-administered medications on file prior to visit.       [facility] - 03/30/21 9381      Pregnancy Intention Screening   Does the patient want to become pregnant in the next year? No    Does the patient's partner want to become pregnant in the next year? No    Would the patient like to discuss contraceptive options today? No      Contraception Wrap Up   Current Method No Method - Other Reason   Pull out method   End Method No Method - Other Reason    Contraception Counseling Provided Yes          The pregnancy intention screening data noted above was reviewed. Potential methods of contraception were discussed. The patient elected to proceed with Withdrawal or Other Method.     Objective:     Vitals:   03/30/21 0934  BP: 115/80  Pulse: 88   Filed Weights   03/30/21 0934  Weight: 134 lb 6.4 oz (61 kg)                Assessment:    [id number] Patient Active Problem  List   Diagnosis Date Noted  . Vitamin D deficiency 09/21/2019  . Marijuana use 05/04/2016     1. Dysfunctional uterine bleeding     Patient regularly having normal menses but she had an irregular cycle last month.  Strongly doubt any pathological reason.  Patient not interested in pregnancy at this time.   Plan:            1.  Discussed multiple methods of birth control.  Patient seems interested in IUD at this time.   Patient to schedule an appointment with her next menstrual period for IUD placement.  2.  Recommend follow-up annual examination and Pap smear at her 4-week IUD follow-up checkup.  Orders No orders of the defined types were placed in this encounter.   No orders of the defined types were placed in this encounter.     F/U  Return in about 3 months (around 06/29/2021) for Annual Physical. I spent 23 minutes involved in the care of this patient preparing to see the patient by obtaining and reviewing her medical history (including labs, imaging tests and prior procedures), documenting clinical information in the electronic health record (EHR), counseling and coordinating care plans, writing and sending prescriptions, ordering tests or procedures and directly communicating with the patient by discussing pertinent items from her history and physical exam as well as detailing my assessment and plan as noted above so that she has an informed understanding.  All of her questions were answered.  Elonda Husky, M.D. 03/30/2021 9:00 PM

## 2021-04-13 ENCOUNTER — Telehealth: Payer: Self-pay | Admitting: Obstetrics and Gynecology

## 2021-04-13 NOTE — Telephone Encounter (Signed)
I have schedule patient to come in tomorrow at 10:45 to see Dr. Logan Bores.

## 2021-04-13 NOTE — Telephone Encounter (Signed)
Voicemail not set up. Please try to get in touch with patient and schedule appointment to be seen.

## 2021-04-13 NOTE — Telephone Encounter (Signed)
New Message:  Pt states she is having itching, white discharge and is very irritated.

## 2021-04-14 ENCOUNTER — Other Ambulatory Visit: Payer: Self-pay

## 2021-04-14 ENCOUNTER — Encounter: Payer: Self-pay | Admitting: Obstetrics and Gynecology

## 2021-04-14 ENCOUNTER — Other Ambulatory Visit (HOSPITAL_COMMUNITY)
Admission: RE | Admit: 2021-04-14 | Discharge: 2021-04-14 | Disposition: A | Discharge status: Home / Self Care | Payer: Medicaid Other | Source: Ambulatory Visit | Attending: Obstetrics and Gynecology | Admitting: Obstetrics and Gynecology

## 2021-04-14 ENCOUNTER — Ambulatory Visit (INDEPENDENT_AMBULATORY_CARE_PROVIDER_SITE_OTHER): Payer: Medicaid Other | Admitting: Obstetrics and Gynecology

## 2021-04-14 VITALS — BP 102/66 | HR 92 | Ht 66.0 in | Wt 135.1 lb

## 2021-04-14 DIAGNOSIS — L292 Pruritus vulvae: Secondary | ICD-10-CM | POA: Diagnosis present

## 2021-04-14 DIAGNOSIS — N898 Other specified noninflammatory disorders of vagina: Secondary | ICD-10-CM

## 2021-04-14 NOTE — Progress Notes (Signed)
HPI:      [name] is a 30 y.o. [id number] who LMP was Patient's last menstrual period was 03/22/2021.  Subjective:   She presents today complaining of a 3-day history of vaginal discharge itching and burning.  She states that she has had this type of thing before but she is not sure if it is BV or yeast. She has no concerns regarding her partner and STDs however she would like to be tested as long as we are doing the cultures for BV and yeast anyway. She is still considering her options for birth control but would like to make an appointment for IUD once she has a menstrual period.    Hx: The following portions of the patient's history were reviewed and updated as appropriate:             She  has a past medical history of Anemia, Eczema, Headache, History of anemia, and UTI (urinary tract infection). She does not have any pertinent problems on file. She  has no past surgical history on file. Her family history includes Asthma in her brother and daughter; Bone cancer in her mother; Breast cancer (age of onset: 1) in her maternal aunt; Diabetes in an other family member; Lupus in her mother; Osteoarthritis in her paternal grandmother. She  reports that she has never smoked. She has never used smokeless tobacco. She reports current drug use. Drug: Marijuana. She reports that she does not drink alcohol. She currently has no medications in their medication list. She has No Known Allergies.       Review of Systems:  Review of Systems  Constitutional: Denied constitutional symptoms, night sweats, recent illness, fatigue, fever, insomnia and weight loss.  Eyes: Denied eye symptoms, eye pain, photophobia, vision change and visual disturbance.  Ears/Nose/Throat/Neck: Denied ear, nose, throat or neck symptoms, hearing loss, nasal discharge, sinus congestion and sore throat.  Cardiovascular: Denied cardiovascular symptoms, arrhythmia, chest pain/pressure, edema, exercise intolerance, orthopnea  and palpitations.  Respiratory: Denied pulmonary symptoms, asthma, pleuritic pain, productive sputum, cough, dyspnea and wheezing.  Gastrointestinal: Denied, gastro-esophageal reflux, melena, nausea and vomiting.  Genitourinary: See HPI for additional information.  Musculoskeletal: Denied musculoskeletal symptoms, stiffness, swelling, muscle weakness and myalgia.  Dermatologic: Denied dermatology symptoms, rash and scar.  Neurologic: Denied neurology symptoms, dizziness, headache, neck pain and syncope.  Psychiatric: Denied psychiatric symptoms, anxiety and depression.  Endocrine: Denied endocrine symptoms including hot flashes and night sweats.   Meds:   No current outpatient medications on file prior to visit.   No current facility-administered medications on file prior to visit.       The pregnancy intention screening data noted above was reviewed. Potential methods of contraception were discussed. The patient elected to proceed with IUD or IUS.     Objective:     Vitals:   04/14/21 1049  BP: 102/66  Pulse: 92   Filed Weights   04/14/21 1049  Weight: 135 lb 1.6 oz (61.3 kg)              Physical examination   Pelvic:   Vulva: Normal appearance.  No lesions.  Vagina: No lesions or abnormalities noted.  Support: Normal pelvic support.  Urethra No masses tenderness or scarring.  Meatus Normal size without lesions or prolapse.  Cervix: Normal appearance.  No lesions.  Anus: Normal exam.  No lesions.  Perineum: Normal exam.  No lesions.        Bimanual   Uterus: Normal size.  Non-tender.  Mobile.  AV.  Adnexae: No masses.  Non-tender to palpation.  Cul-de-sac: Negative for abnormality.     Assessment:    [id number] Patient Active Problem List   Diagnosis Date Noted  . Vitamin D deficiency 09/21/2019  . Marijuana use 05/04/2016     1. Vulvar itching   2. Vaginal irritation   3. Vaginal discharge        Plan:            1.  Nuswab performed-await  results.  We will contact patient.  2.  Use of probiotics and possible future use of boric acid discussed.  Patient will try probiotics to prevent recurrence.  3.  Patient to schedule for IUD with menses. Orders No orders of the defined types were placed in this encounter.   No orders of the defined types were placed in this encounter.     F/U  No follow-ups on file. I spent 23 minutes involved in the care of this patient preparing to see the patient by obtaining and reviewing her medical history (including labs, imaging tests and prior procedures), documenting clinical information in the electronic health record (EHR), counseling and coordinating care plans, writing and sending prescriptions, ordering tests or procedures and directly communicating with the patient by discussing pertinent items from her history and physical exam as well as detailing my assessment and plan as noted above so that she has an informed understanding.  All of her questions were answered.  Elonda Husky, M.D. 04/14/2021 11:02 AM

## 2021-04-14 NOTE — Addendum Note (Signed)
Addended by: Dorian Pod on: 04/14/2021 11:13 AM   Modules accepted: Orders

## 2021-04-15 ENCOUNTER — Encounter: Payer: Self-pay | Admitting: Surgical

## 2021-04-15 LAB — CERVICOVAGINAL ANCILLARY ONLY
Bacterial Vaginitis (gardnerella): POSITIVE — AB
Candida Glabrata: NEGATIVE
Candida Vaginitis: POSITIVE — AB
Chlamydia: NEGATIVE
Comment: NEGATIVE
Comment: NEGATIVE
Comment: NEGATIVE
Comment: NEGATIVE
Comment: NEGATIVE
Comment: NORMAL
Neisseria Gonorrhea: NEGATIVE
Trichomonas: POSITIVE — AB

## 2021-04-16 ENCOUNTER — Other Ambulatory Visit: Payer: Self-pay | Admitting: Surgical

## 2021-04-16 MED ORDER — METRONIDAZOLE 500 MG PO TABS: 500.0000 mg | ORAL_TABLET | Freq: Two times a day (BID) | ORAL | 0 refills | Status: DC

## 2021-04-16 MED ORDER — FLUCONAZOLE 150 MG PO TABS: 150.0000 mg | ORAL_TABLET | Freq: Every day | ORAL | 0 refills | Status: DC

## 2021-10-29 ENCOUNTER — Telehealth: Payer: Self-pay

## 2021-10-29 NOTE — Telephone Encounter (Signed)
Patient called with concerns of small amount of pink blood when she wiped. She denies cramping. She had intercourse recently. I advised patient to monitor her bleeding and if bleeding increased and she started to having severe cramping to call us back or go to UC to be evaluated. She verbalized understanding, she said she thinks she just panicked when she saw the blood.

## 2021-11-05 NOTE — Telephone Encounter (Signed)
Pt is calling in stating that she is having real bad headaches and have been taking tylenol and it is not doing anything to help.  Pt would like to know what she needs to do.

## 2021-11-13 NOTE — Patient Instructions (Incomplete)
Commonly Asked Questions During Pregnancy  Cats: A parasite can be excreted in cat feces.  To avoid exposure you need to have another person empty the little box.  If you must empty the litter box you will need to wear gloves.  Wash your hands after handling your cat.  This parasite can also be found in raw or undercooked meat so this should also be avoided.  Colds, Sore Throats, Flu: Please check your medication sheet to see what you can take for symptoms.  If your symptoms are unrelieved by these medications please call the office.  Dental Work: Most any dental work your dentist recommends is permitted.  X-rays should only be taken during the first trimester if absolutely necessary.  Your abdomen should be shielded with a lead apron during all x-rays.  Please notify your provider prior to receiving any x-rays.  Novocaine is fine; gas is not recommended.  If your dentist requires a note from us prior to dental work please call the office and we will provide one for you.  Exercise: Exercise is an important part of staying healthy during your pregnancy.  You may continue most exercises you were accustomed to prior to pregnancy.  Later in your pregnancy you will most likely notice you have difficulty with activities requiring balance like riding a bicycle.  It is important that you listen to your body and avoid activities that put you at a higher risk of falling.  Adequate rest and staying well hydrated are a must!  If you have questions about the safety of specific activities ask your provider.    Exposure to Children with illness: Try to avoid obvious exposure; report any symptoms to us when noted,  If you have chicken pos, red measles or mumps, you should be immune to these diseases.   Please do not take any vaccines while pregnant unless you have checked with your OB provider.  Fetal Movement: After 28 weeks we recommend you do "kick counts" twice daily.  Lie or sit down in a calm quiet environment and  count your baby movements "kicks".  You should feel your baby at least 10 times per hour.  If you have not felt 10 kicks within the first hour get up, walk around and have something sweet to eat or drink then repeat for an additional hour.  If count remains less than 10 per hour notify your provider.  Fumigating: Follow your pest control agent's advice as to how long to stay out of your home.  Ventilate the area well before re-entering.  Hemorrhoids:   Most over-the-counter preparations can be used during pregnancy.  Check your medication to see what is safe to use.  It is important to use a stool softener or fiber in your diet and to drink lots of liquids.  If hemorrhoids seem to be getting worse please call the office.   Hot Tubs:  Hot tubs Jacuzzis and saunas are not recommended while pregnant.  These increase your internal body temperature and should be avoided.  Intercourse:  Sexual intercourse is safe during pregnancy as long as you are comfortable, unless otherwise advised by your provider.  Spotting may occur after intercourse; report any bright red bleeding that is heavier than spotting.  Labor:  If you know that you are in labor, please go to the hospital.  If you are unsure, please call the office and let us help you decide what to do.  Lifting, straining, etc:  If your job requires heavy   lifting or straining please check with your provider for any limitations.  Generally, you should not lift items heavier than that you can lift simply with your hands and arms (no back muscles)  Painting:  Paint fumes do not harm your pregnancy, but may make you ill and should be avoided if possible.  Latex or water based paints have less odor than oils.  Use adequate ventilation while painting.  Permanents & Hair Color:  Chemicals in hair dyes are not recommended as they cause increase hair dryness which can increase hair loss during pregnancy.  " Highlighting" and permanents are allowed.  Dye may be  absorbed differently and permanents may not hold as well during pregnancy.  Sunbathing:  Use a sunscreen, as skin burns easily during pregnancy.  Drink plenty of fluids; avoid over heating.  Tanning Beds:  Because their possible side effects are still unknown, tanning beds are not recommended.  Ultrasound Scans:  Routine ultrasounds are performed at approximately 20 weeks.  You will be able to see your baby's general anatomy an if you would like to know the gender this can usually be determined as well.  If it is questionable when you conceived you may also receive an ultrasound early in your pregnancy for dating purposes.  Otherwise ultrasound exams are not routinely performed unless there is a medical necessity.  Although you can request a scan we ask that you pay for it when conducted because insurance does not cover " patient request" scans.  Work: If your pregnancy proceeds without complications you may work until your due date, unless your physician or employer advises otherwise.  Round Ligament Pain/Pelvic Discomfort:  Sharp, shooting pains not associated with bleeding are fairly common, usually occurring in the second trimester of pregnancy.  They tend to be worse when standing up or when you remain standing for long periods of time.  These are the result of pressure of certain pelvic ligaments called "round ligaments".  Rest, Tylenol and heat seem to be the most effective relief.  As the womb and fetus grow, they rise out of the pelvis and the discomfort improves.  Please notify the office if your pain seems different than that described.  It may represent a more serious condition.  First Trimester of Pregnancy The first trimester of pregnancy starts on the first day of your last menstrual period until the end of week 12. This is also called months 1 through 3 of pregnancy. Body changes during your first trimester Your body goes through many changes during pregnancy. The changes usually  return to normal after your baby is born. Physical changes You may gain or lose weight. Your breasts may grow larger and hurt. The area around your nipples may get darker. Dark spots or blotches may develop on your face. You may have changes in your hair. Health changes You may feel like you might vomit (nauseous), and you may vomit. You may have heartburn. You may have headaches. You may have trouble pooping (constipation). Your gums may bleed. Other changes You may get tired easily. You may pee (urinate) more often. Your menstrual periods will stop. You may not feel hungry. You may want to eat certain kinds of food. You may have changes in your emotions from day to day. You may have more dreams. Follow these instructions at home: Medicines Take over-the-counter and prescription medicines only as told by your doctor. Some medicines are not safe during pregnancy. Take a prenatal vitamin that contains at least 600 micrograms (  mcg) of folic acid. °Eating and drinking °Eat healthy meals that include: °Fresh fruits and vegetables. °Whole grains. °Good sources of protein, such as meat, eggs, or tofu. °Low-fat dairy products. °Avoid raw meat and unpasteurized juice, milk, and cheese. °If you feel like you may vomit, or you vomit: °Eat 4 or 5 small meals a day instead of 3 large meals. °Try eating a few soda crackers. °Drink liquids between meals instead of during meals. °You may need to take these actions to prevent or treat trouble pooping: °Drink enough fluids to keep your pee (urine) pale yellow. °Eat foods that are high in fiber. These include beans, whole grains, and fresh fruits and vegetables. °Limit foods that are high in fat and sugar. These include fried or sweet foods. °Activity °Exercise only as told by your doctor. Most people can do their usual exercise routine during pregnancy. °Stop exercising if you have cramps or pain in your lower belly (abdomen) or low back. °Do not exercise if  it is too hot or too humid, or if you are in a place of great height (high altitude). °Avoid heavy lifting. °If you choose to, you may have sex unless your doctor tells you not to. °Relieving pain and discomfort °Wear a good support bra if your breasts are sore. °Rest with your legs raised (elevated) if you have leg cramps or low back pain. °If you have bulging veins (varicose veins) in your legs: °Wear support hose as told by your doctor. °Raise your feet for 15 minutes, 3-4 times a day. °Limit salt in your food. °Safety °Wear your seat belt at all times when you are in a car. °Talk with your doctor if someone is hurting you or yelling at you. °Talk with your doctor if you are feeling sad or have thoughts of hurting yourself. °Lifestyle °Do not use hot tubs, steam rooms, or saunas. °Do not douche. Do not use tampons or scented sanitary pads. °Do not use herbal medicines, illegal drugs, or medicines that are not approved by your doctor. Do not drink alcohol. °Do not smoke or use any products that contain nicotine or tobacco. If you need help quitting, ask your doctor. °Avoid cat litter boxes and soil that is used by cats. These carry germs that can cause harm to the baby and can cause a loss of your baby by miscarriage or stillbirth. °General instructions °Keep all follow-up visits. This is important. °Ask for help if you need counseling or if you need help with nutrition. Your doctor can give you advice or tell you where to go for help. °Visit your dentist. At home, brush your teeth with a soft toothbrush. Floss gently. °Write down your questions. Take them to your prenatal visits. °Where to find more information °American Pregnancy Association: americanpregnancy.org °American College of Obstetricians and Gynecologists: www.acog.org °Office on Women's Health: womenshealth.gov/pregnancy °Contact a doctor if: °You are dizzy. °You have a fever. °You have mild cramps or pressure in your lower belly. °You have a nagging  pain in your belly area. °You continue to feel like you may vomit, you vomit, or you have watery poop (diarrhea) for 24 hours or longer. °You have a bad-smelling fluid coming from your vagina. °You have pain when you pee. °You are exposed to a disease that spreads from person to person, such as chickenpox, measles, Zika virus, HIV, or hepatitis. °Get help right away if: °You have spotting or bleeding from your vagina. °You have very bad belly cramping or pain. °You have shortness   of breath or chest pain. °You have any kind of injury, such as from a fall or a car crash. °You have new or increased pain, swelling, or redness in an arm or leg. °Summary °The first trimester of pregnancy starts on the first day of your last menstrual period until the end of week 12 (months 1 through 3). °Eat 4 or 5 small meals a day instead of 3 large meals. °Do not smoke or use any products that contain nicotine or tobacco. If you need help quitting, ask your doctor. °Keep all follow-up visits. °This information is not intended to replace advice given to you by your health care provider. Make sure you discuss any questions you have with your health care provider. °Document Revised: 05/21/2020 Document Reviewed: 03/27/2020 °Elsevier Patient Education © 2022 [employer] °Morning Sickness °Morning sickness is when you feel like you may vomit (feel nauseous) during pregnancy. Sometimes, you may vomit. Morning sickness most often happens in the morning, but it can also happen at any time of the day. Some women may have morning sickness that makes them vomit all the time. This is a more serious problem that needs treatment. °What are the causes? °The cause of this condition is not known. °What increases the risk? °You had vomiting or a feeling like you may vomit before your pregnancy. °You had morning sickness in another pregnancy. °You are pregnant with more than one baby, such as twins. °What are the signs or symptoms? °Feeling like you  may vomit. °Vomiting. °How is this treated? °Treatment is usually not needed for this condition. You may only need to change what you eat. In some cases, your doctor may give you some things to take for your condition. These include: °Vitamin B6 supplements. °Medicines to treat the feeling that you may vomit. °Ginger. °Follow these instructions at home: °Medicines °Take over-the-counter and prescription medicines only as told by your doctor. Do not take any medicines until you talk with your doctor about them first. °Take multivitamins before you get pregnant. These can stop or lessen the symptoms of morning sickness. °Eating and drinking °Eat dry toast or crackers before getting out of bed. °Eat 5 or 6 small meals a day. °Eat dry and bland foods like rice and baked potatoes. °Do not eat greasy, fatty, or spicy foods. °Have someone cook for you if the smell of food causes you to vomit or to feel like you may vomit. °If you feel like you may vomit after taking prenatal vitamins, take them at night or with a snack. °Eat protein foods when you need a snack. Nuts, yogurt, and cheese are good choices. °Drink fluids throughout the day. °Try ginger ale made with real ginger, ginger tea made from fresh grated ginger, or ginger candies. °General instructions °Do not smoke or use any products that contain nicotine or tobacco. If you need help quitting, ask your doctor. °Use an air purifier to keep the air in your house free of smells. °Get lots of fresh air. °Try to avoid smells that make you feel sick. °Try wearing an acupressure wristband. This is a wristband that is used to treat seasickness. °Try a treatment called acupuncture. In this treatment, a doctor puts needles into certain areas of your body to make you feel better. °Contact a doctor if: °You need medicine to feel better. °You feel dizzy or light-headed. °You are losing weight. °Get help right away if: °The feeling that you may vomit will not go away, or you  cannot   stop vomiting. °You faint. °You have very bad pain in your belly. °Summary °Morning sickness is when you feel like you may vomit (feel nauseous) during pregnancy. °You may feel sick in the morning, but you can feel this way at any time of the day. °Making some changes to what you eat may help your symptoms go away. °This information is not intended to replace advice given to you by your health care provider. Make sure you discuss any questions you have with your health care provider. °Document Revised: 07/28/2020 Document Reviewed: 07/07/2020 °Elsevier Patient Education © 2022 Elsevier Inc. ° °

## 2021-11-16 ENCOUNTER — Ambulatory Visit (INDEPENDENT_AMBULATORY_CARE_PROVIDER_SITE_OTHER): Payer: Medicaid Other | Admitting: Obstetrics and Gynecology

## 2021-11-16 ENCOUNTER — Other Ambulatory Visit: Payer: Self-pay

## 2021-11-16 VITALS — BP 107/71 | HR 87 | Resp 16 | Ht 66.0 in | Wt 132.1 lb

## 2021-11-16 DIAGNOSIS — Z113 Encounter for screening for infections with a predominantly sexual mode of transmission: Secondary | ICD-10-CM | POA: Diagnosis not present

## 2021-11-16 DIAGNOSIS — Z3481 Encounter for supervision of other normal pregnancy, first trimester: Secondary | ICD-10-CM

## 2021-11-16 DIAGNOSIS — Z3A08 8 weeks gestation of pregnancy: Secondary | ICD-10-CM | POA: Diagnosis not present

## 2021-11-16 MED ORDER — PRENATE PIXIE 10-0.6-0.4-200 MG PO CAPS: 1.0000 | ORAL_CAPSULE | Freq: Every day | ORAL | 3 refills | Status: DC

## 2021-11-16 NOTE — Progress Notes (Signed)
Manson Allan presents for NOB nurse interview visit. Pregnancy confirmation done 10/19/2021.  [id number]. Pregnancy education material explained and given. No cats in the home. NOB labs ordered. Sickle cell ordered. HIV labs and Drug screen were explained optional and she did not decline. Drug screen ordered. PNV encouraged, samples given and rx sent to pharmacy. Genetic screening options discussed. Genetic testing: Ordered.  Pt may discuss with provider.  Financial policy not applicable. FMLA form reviewed and signed. Pt. to follow up with [facility] in 4 weeks for NOB physical.  All questions answered.

## 2021-11-17 LAB — HGB SOLU + RFLX FRAC: Sickle Solubility Test - HGBRFX: NEGATIVE

## 2021-11-17 LAB — RUBELLA SCREEN: Rubella Antibodies, IGG: 4.8 index (ref >0.99)

## 2021-11-17 LAB — VIRAL HEPATITIS HBV, HCV
HCV Ab: <0.1 s/co ratio (ref 0.0–0.9)
Hep B Core Total Ab: NEGATIVE
Hep B Surface Ab, Qual: REACTIVE
Hepatitis B Surface Ag: NEGATIVE

## 2021-11-17 LAB — URINALYSIS, ROUTINE W REFLEX MICROSCOPIC
Bilirubin, UA: NEGATIVE
Glucose, UA: NEGATIVE
Ketones, UA: NEGATIVE
Leukocytes,UA: NEGATIVE
Nitrite, UA: NEGATIVE
Protein,UA: NEGATIVE
RBC, UA: NEGATIVE
Specific Gravity, UA: 1.019 (ref 1.005–1.030)
Urobilinogen, Ur: 0.2 mg/dL (ref 0.2–1.0)
pH, UA: 6.5 (ref 5.0–7.5)

## 2021-11-17 LAB — HIV ANTIBODY (ROUTINE TESTING W REFLEX): HIV Screen 4th Generation wRfx: NONREACTIVE

## 2021-11-17 LAB — PARVOVIRUS B19 ANTIBODY, IGG AND IGM
Parvovirus B19 IgG: 2.7 index — ABNORMAL HIGH (ref 0.0–0.8)
Parvovirus B19 IgM: 0.1 index (ref 0.0–0.8)

## 2021-11-17 LAB — HCV INTERPRETATION

## 2021-11-17 LAB — VARICELLA ZOSTER ANTIBODY, IGG: Varicella zoster IgG: 480 index (ref >165)

## 2021-11-17 LAB — ABO AND RH: Rh Factor: POSITIVE

## 2021-11-17 LAB — ANTIBODY SCREEN: Antibody Screen: NEGATIVE

## 2021-11-17 LAB — RPR: RPR Ser Ql: NONREACTIVE

## 2021-11-18 LAB — PAIN MGT SCRN (14 DRUGS), UR
Amphetamine Scrn, Ur: NEGATIVE ng/mL
BARBITURATE SCREEN URINE: NEGATIVE ng/mL
BENZODIAZEPINE SCREEN, URINE: NEGATIVE ng/mL
Buprenorphine, Urine: NEGATIVE ng/mL
CANNABINOIDS UR QL SCN: POSITIVE ng/mL — AB
Cocaine (Metab) Scrn, Ur: NEGATIVE ng/mL
Creatinine(Crt), U: 79.8 mg/dL (ref 20.0–300.0)
Fentanyl, Urine: NEGATIVE pg/mL
Meperidine Screen, Urine: NEGATIVE ng/mL
Methadone Screen, Urine: NEGATIVE ng/mL
OXYCODONE+OXYMORPHONE UR QL SCN: NEGATIVE ng/mL
Opiate Scrn, Ur: NEGATIVE ng/mL
Ph of Urine: 6 (ref 4.5–8.9)
Phencyclidine Qn, Ur: NEGATIVE ng/mL
Propoxyphene Scrn, Ur: NEGATIVE ng/mL
Tramadol Screen, Urine: NEGATIVE ng/mL

## 2021-11-18 LAB — NICOTINE SCREEN, URINE: Cotinine Ql Scrn, Ur: NEGATIVE ng/mL

## 2021-11-18 LAB — GC/CHLAMYDIA PROBE AMP
Chlamydia trachomatis, NAA: NEGATIVE
Neisseria Gonorrhoeae by PCR: NEGATIVE

## 2021-11-18 LAB — CULTURE, OB URINE

## 2021-11-18 LAB — URINE CULTURE, OB REFLEX

## 2021-12-27 NOTE — L&D Delivery Note (Signed)
Delivery Note At  0954, a viable female was delivered vaginally over one contraction.Patient had baby in OP position, was positioned with the peanut ball, and several contractions later was complete and in OA. No nuchal cord noted once head delivered. A FSE was placed over the last 10 minutes of labor due to some decelerations noted with contractions. The anterior shoulder delivered with gentle downward guidance; then the posterior should quickly followed. Spontaneous respirations noted. The baby was immediately placed on the maternal abdomen for drying an stimulation.(Presentation:  OA to LOT    ).  APGAR: 8, 9, ; weight pending .  The cord was double clamped, then cut by the grandmother once no longer pulsing. Placenta status: delivered spontaneously at [phone] ,  .  Cord: 3 vessel cord.  with the following complications:  none.   Anesthesia:  none Episiotomy:  none Lacerations:  none Suture Repair:  none Est. Blood Loss (mL):  200  Mom to postpartum.  Baby to Couplet care / Skin to Skin The patient intend to breastfeed.  Mirna Mires 07/01/2022, 10:22 AM

## 2021-12-29 ENCOUNTER — Ambulatory Visit (INDEPENDENT_AMBULATORY_CARE_PROVIDER_SITE_OTHER): Payer: Medicaid Other | Admitting: Obstetrics and Gynecology

## 2021-12-29 ENCOUNTER — Encounter: Payer: Self-pay | Admitting: Obstetrics and Gynecology

## 2021-12-29 ENCOUNTER — Other Ambulatory Visit: Payer: Self-pay

## 2021-12-29 VITALS — BP 113/74 | HR 74 | Wt 145.0 lb

## 2021-12-29 DIAGNOSIS — Z3A14 14 weeks gestation of pregnancy: Secondary | ICD-10-CM

## 2021-12-29 DIAGNOSIS — Z7689 Persons encountering health services in other specified circumstances: Secondary | ICD-10-CM

## 2021-12-29 NOTE — Addendum Note (Signed)
Addended by: Elonda Husky on: 12/29/2021 09:13 AM   Modules accepted: Orders

## 2021-12-29 NOTE — Progress Notes (Signed)
HPI:      [name] is a 31 y.o. [phone] who LMP was Patient's last menstrual period was 09/20/2021.  Subjective:   She presents today for [facility] of [facility].  She has no idea how far along she is but thinks she is feeling daily fetal movement.  She is taking prenatal vitamins.  She has no complaints today. She plans to breast-feed. She has had 2 prior vaginal births without complication.    Hx: The following portions of the patient's history were reviewed and updated as appropriate:             She  has a past medical history of Anemia, Eczema, Headache, History of anemia, and UTI (urinary tract infection). She does not have any pertinent problems on file. She  has no past surgical history on file. Her family history includes Asthma in her brother and daughter; Breast cancer in her mother; Breast cancer (age of onset: 26) in her maternal aunt; Diabetes in an other family member; Lupus in her mother; Osteoarthritis in her paternal grandmother. She  reports that she has never smoked. She has never used smokeless tobacco. She reports that she does not currently use drugs after having used the following drugs: Marijuana. She reports that she does not drink alcohol. She has a current medication list which includes the following prescription(s): prenate pixie. She has No Known Allergies.       Review of Systems:  Review of Systems  Constitutional: Denied constitutional symptoms, night sweats, recent illness, fatigue, fever, insomnia and weight loss.  Eyes: Denied eye symptoms, eye pain, photophobia, vision change and visual disturbance.  Ears/Nose/Throat/Neck: Denied ear, nose, throat or neck symptoms, hearing loss, nasal discharge, sinus congestion and sore throat.  Cardiovascular: Denied cardiovascular symptoms, arrhythmia, chest pain/pressure, edema, exercise intolerance, orthopnea and palpitations.  Respiratory: Denied pulmonary symptoms, asthma, pleuritic  pain, productive sputum, cough, dyspnea and wheezing.  Gastrointestinal: Denied, gastro-esophageal reflux, melena, nausea and vomiting.  Genitourinary: Denied genitourinary symptoms including symptomatic vaginal discharge, pelvic relaxation issues, and urinary complaints.  Musculoskeletal: Denied musculoskeletal symptoms, stiffness, swelling, muscle weakness and myalgia.  Dermatologic: Denied dermatology symptoms, rash and scar.  Neurologic: Denied neurology symptoms, dizziness, headache, neck pain and syncope.  Psychiatric: Denied psychiatric symptoms, anxiety and depression.  Endocrine: Denied endocrine symptoms including hot flashes and night sweats.   Meds:   Current Outpatient Medications on File Prior to Visit  Medication Sig Dispense Refill   Prenat-FeAsp-Meth-FA-DHA w/o A (PRENATE PIXIE) 10-0.6-0.4-200 MG CAPS Take 1 capsule by mouth daily.     No current facility-administered medications on file prior to visit.      Objective:     Vitals:   12/29/21 0808  BP: 113/74  Pulse: 74   Filed Weights   12/29/21 0808  Weight: 145 lb (65.8 kg)              Physical examination General NAD, Conversant  HEENT Atraumatic; Op clear with mmm.  Normo-cephalic. Pupils reactive. Anicteric sclerae  Thyroid/Neck Smooth without nodularity or enlargement. Normal ROM.  Neck Supple.  Skin No rashes, lesions or ulceration. Normal palpated skin turgor. No nodularity.  Breasts: No masses or discharge.  Symmetric.  No axillary adenopathy.  Lungs: Clear to auscultation.No rales or wheezes. Normal Respiratory effort, no retractions.  Heart: NSR.  No murmurs or rubs appreciated. No periferal edema  Abdomen: Soft.  Non-tender.  No masses.  No HSM. No hernia  Extremities: Moves all appropriately.  Normal ROM for  age. No lymphadenopathy.  Neuro: Oriented to PPT.  Normal mood. Normal affect.     Pelvic:   Vulva: Normal appearance.  No lesions.  Vagina: No lesions or abnormalities noted.   Support: Normal pelvic support.  Urethra No masses tenderness or scarring.  Meatus Normal size without lesions or prolapse.  Cervix: Normal appearance.  No lesions.  Anus: Normal exam.  No lesions.  Perineum: Normal exam.  No lesions.        Bimanual   Adnexae: No masses.  Non-tender to palpation.  Uterus: Enlarged. 15 wks FHTs - 159  Non-tender.  Mobile.  AV.  Adnexae: No masses.  Non-tender to palpation.  Cul-de-sac: Negative for abnormality.  Adnexae: No masses.  Non-tender to palpation.         Pelvimetry   Diagonal: Reached.  Spines: Average.  Sacrum: Concave.  Pubic Arch: Normal.             Assessment:    [id number] Patient Active Problem List   Diagnosis Date Noted   Vitamin D deficiency 09/21/2019   Marijuana use 05/04/2016     1. Encounter to establish care   2. [redacted] weeks gestation of pregnancy        Plan:            Prenatal Plan 1.  The patient was given prenatal literature. 2.  She was continued on prenatal vitamins. 3.  A prenatal lab panel to be drawn at nurse visit. 4.  An ultrasound was ordered to better determine an EDC and FAS in 6 weeks 5.  A nurse visit was scheduled. 6.  Genetic testing and testing for other inheritable conditions discussed in detail. She will decide in the future whether to have these labs performed. 7.  A general overview of pregnancy testing, visit schedule, ultrasound schedule, and prenatal care was discussed. 8.  Epidural and Waterbirth discussed (pt experience) 9.  Benefits of breast-feeding discussed in detail including both maternal and infant benefits. Ready Set Baby website discussed.  Orders Orders Placed This Encounter  Procedures   US OB DETAIL + 14 WK   US OB Comp + 14 Wk    No orders of the defined types were placed in this encounter.     F/U  Return in about 4 weeks (around 01/26/2022) for [facility]. I spent 32 minutes involved in the care of this patient preparing to see the patient by obtaining and  reviewing her medical history (including labs, imaging tests and prior procedures), documenting clinical information in the electronic health record (EHR), counseling and coordinating care plans, writing and sending prescriptions, ordering tests or procedures and in direct communicating with the patient and medical staff discussing pertinent items from her history and physical exam.  Finis Bud, M.D. 12/29/2021 8:43 AM

## 2021-12-31 ENCOUNTER — Other Ambulatory Visit (INDEPENDENT_AMBULATORY_CARE_PROVIDER_SITE_OTHER): Payer: Medicaid Other

## 2021-12-31 ENCOUNTER — Other Ambulatory Visit: Payer: Self-pay

## 2021-12-31 ENCOUNTER — Other Ambulatory Visit: Payer: Self-pay | Admitting: Obstetrics and Gynecology

## 2021-12-31 DIAGNOSIS — Z3687 Encounter for antenatal screening for uncertain dates: Secondary | ICD-10-CM

## 2022-01-02 LAB — MATERNIT 21 PLUS CORE, BLOOD
Fetal Fraction: 8
Result (T21): NEGATIVE
Trisomy 13 (Patau syndrome): NEGATIVE
Trisomy 18 (Edwards syndrome): NEGATIVE
Trisomy 21 (Down syndrome): NEGATIVE

## 2022-01-26 ENCOUNTER — Encounter: Payer: Medicaid Other | Admitting: Obstetrics and Gynecology

## 2022-01-31 ENCOUNTER — Encounter: Payer: Self-pay | Admitting: Obstetrics and Gynecology

## 2022-02-09 ENCOUNTER — Other Ambulatory Visit: Payer: Self-pay

## 2022-02-09 ENCOUNTER — Ambulatory Visit (INDEPENDENT_AMBULATORY_CARE_PROVIDER_SITE_OTHER): Payer: Medicaid Other

## 2022-02-09 DIAGNOSIS — Z3481 Encounter for supervision of other normal pregnancy, first trimester: Secondary | ICD-10-CM | POA: Diagnosis not present

## 2022-02-09 DIAGNOSIS — Z3A14 14 weeks gestation of pregnancy: Secondary | ICD-10-CM

## 2022-02-19 ENCOUNTER — Encounter: Payer: Self-pay | Admitting: Obstetrics and Gynecology

## 2022-02-19 ENCOUNTER — Other Ambulatory Visit: Payer: Self-pay

## 2022-02-19 ENCOUNTER — Ambulatory Visit (INDEPENDENT_AMBULATORY_CARE_PROVIDER_SITE_OTHER): Payer: Medicaid Other | Admitting: Obstetrics and Gynecology

## 2022-02-19 VITALS — BP 102/67 | HR 96 | Wt 162.1 lb

## 2022-02-19 DIAGNOSIS — Z3A21 21 weeks gestation of pregnancy: Secondary | ICD-10-CM

## 2022-02-19 DIAGNOSIS — O99891 Other specified diseases and conditions complicating pregnancy: Secondary | ICD-10-CM

## 2022-02-19 DIAGNOSIS — Z3482 Encounter for supervision of other normal pregnancy, second trimester: Secondary | ICD-10-CM

## 2022-02-19 DIAGNOSIS — Z8659 Personal history of other mental and behavioral disorders: Secondary | ICD-10-CM

## 2022-02-19 DIAGNOSIS — Z1379 Encounter for other screening for genetic and chromosomal anomalies: Secondary | ICD-10-CM

## 2022-02-19 DIAGNOSIS — R143 Flatulence: Secondary | ICD-10-CM

## 2022-02-19 LAB — POCT URINALYSIS DIPSTICK OB
Bilirubin, UA: NEGATIVE
Blood, UA: NEGATIVE
Glucose, UA: NEGATIVE
Ketones, UA: NEGATIVE
Leukocytes, UA: NEGATIVE
Nitrite, UA: NEGATIVE
POC,PROTEIN,UA: NEGATIVE
Spec Grav, UA: 1.01 (ref 1.010–1.025)
Urobilinogen, UA: 0.2 E.U./dL
pH, UA: 6.5 (ref 5.0–8.0)

## 2022-02-19 NOTE — Progress Notes (Signed)
ROB: Reports lots of gas and bloating lately. Discussed use of Gas-X/Bean-O, dietary modifications. Normal genetics.  Normal anatomy scan.  RTC in 4 weeks. Plans to breastfeed. Discussed labor plans, notes that she was considering a water birth. Noted that if she did, would plan for transfer of care to midwifery. Also discussed other methods of hydrotherapy. Also has questions regarding lotus birth, and placental encapsulation.  Reviewed delayed cord clamping (standard 2 min vs cessation of pulsation). Answered all questions to her satisfaction. Notes she will think this over. Given handout on birth plan. RTC in 4 weeks.

## 2022-03-08 ENCOUNTER — Encounter: Payer: Self-pay | Admitting: Obstetrics and Gynecology

## 2022-03-19 ENCOUNTER — Ambulatory Visit (INDEPENDENT_AMBULATORY_CARE_PROVIDER_SITE_OTHER): Payer: Medicaid Other | Admitting: Obstetrics and Gynecology

## 2022-03-19 ENCOUNTER — Encounter: Payer: Self-pay | Admitting: Obstetrics and Gynecology

## 2022-03-19 ENCOUNTER — Other Ambulatory Visit: Payer: Self-pay

## 2022-03-19 VITALS — BP 101/69 | HR 97 | Wt 171.4 lb

## 2022-03-19 DIAGNOSIS — Z3482 Encounter for supervision of other normal pregnancy, second trimester: Secondary | ICD-10-CM

## 2022-03-19 DIAGNOSIS — Z3A25 25 weeks gestation of pregnancy: Secondary | ICD-10-CM

## 2022-03-19 LAB — POCT URINALYSIS DIPSTICK OB
Bilirubin, UA: NEGATIVE
Blood, UA: NEGATIVE
Glucose, UA: NEGATIVE
Ketones, UA: NEGATIVE
Leukocytes, UA: NEGATIVE
Nitrite, UA: NEGATIVE
POC,PROTEIN,UA: NEGATIVE
Spec Grav, UA: 1.025 (ref 1.010–1.025)
Urobilinogen, UA: 0.2 E.U./dL
pH, UA: 6 (ref 5.0–8.0)

## 2022-03-19 NOTE — Progress Notes (Signed)
ROB: No significant complaints today.  Has occasional round ligament pain-discussed.  Has confirmed that she desires water birth and understands she will be transferred to midwifery service.  1 hour GCT next visit. ?

## 2022-03-19 NOTE — Progress Notes (Signed)
ROB. Patient states fetal movement with pain and pressure. She also states feeling very short of breath, especially at work due to having to read lengthy disclosures over the phone. Patient states she is still interesting in a water birth.Patient states no questions or concerns at this time.   ?

## 2022-04-06 ENCOUNTER — Other Ambulatory Visit: Payer: Medicaid Other

## 2022-04-06 ENCOUNTER — Ambulatory Visit (INDEPENDENT_AMBULATORY_CARE_PROVIDER_SITE_OTHER): Payer: Medicaid Other | Admitting: Certified Nurse Midwife

## 2022-04-06 ENCOUNTER — Encounter: Payer: Self-pay | Admitting: Certified Nurse Midwife

## 2022-04-06 VITALS — BP 101/67 | HR 98 | Wt 177.6 lb

## 2022-04-06 DIAGNOSIS — Z3483 Encounter for supervision of other normal pregnancy, third trimester: Secondary | ICD-10-CM

## 2022-04-06 DIAGNOSIS — Z23 Encounter for immunization: Secondary | ICD-10-CM

## 2022-04-06 DIAGNOSIS — Z3A28 28 weeks gestation of pregnancy: Secondary | ICD-10-CM

## 2022-04-06 MED ORDER — TETANUS-DIPHTH-ACELL PERTUSSIS 5-2.5-18.5 LF-MCG/0.5 IM SUSY: 0.5000 mL | PREFILLED_SYRINGE | Freq: Once | INTRAMUSCULAR | Status: DC

## 2022-04-06 NOTE — Patient Instructions (Signed)
Oral Glucose Tolerance Test During Pregnancy °Why am I having this test? °The oral glucose tolerance test (OGTT) is done to check how your body processes blood sugar (glucose). This is one of several tests used to diagnose diabetes that develops during pregnancy (gestational diabetes mellitus). Gestational diabetes is a short-term form of diabetes that some women develop while they are pregnant. It usually occurs during the second trimester of pregnancy and goes away after delivery. °Testing, or screening, for gestational diabetes usually occurs at weeks 24-28 of pregnancy. You may have the OGTT test after having a 1-hour glucose screening test if the results from that test indicate that you may have gestational diabetes. This test may also be needed if: °You have a history of gestational diabetes. °There is a history of giving birth to very large babies or of losing pregnancies (having stillbirths). °You have signs and symptoms of diabetes, such as: °Changes in your eyesight. °Tingling or numbness in your hands or feet. °Changes in hunger, thirst, and urination, and these are not explained by your pregnancy. °What is being tested? °This test measures the amount of glucose in your blood at different times during a period of 3 hours. This shows how well your body can process glucose. °What kind of sample is taken? °Blood samples are required for this test. They are usually collected by inserting a needle into a blood vessel. °How do I prepare for this test? °For 3 days before your test, eat normally. Have plenty of carbohydrate-rich foods. °Follow instructions from your health care provider about: °Eating or drinking restrictions on the day of the test. You may be asked not to eat or drink anything other than water (to fast) starting 8-10 hours before the test. °Changing or stopping your regular medicines. Some medicines may interfere with this test. °Tell a health care provider about: °All medicines you are taking,  including vitamins, herbs, eye drops, creams, and over-the-counter medicines. °Any blood disorders you have. °Any surgeries you have had. °Any medical conditions you have. °What happens during the test? °First, your blood glucose will be measured. This is referred to as your fasting blood glucose because you fasted before the test. Then, you will drink a glucose solution that contains a certain amount of glucose. Your blood glucose will be measured again 1, 2, and 3 hours after you drink the solution. °This test takes about 3 hours to complete. You will need to stay at the testing location during this time. During the testing period: °Do not eat or drink anything other than the glucose solution. °Do not exercise. °Do not use any products that contain nicotine or tobacco, such as cigarettes, e-cigarettes, and chewing tobacco. These can affect your test results. If you need help quitting, ask your health care provider. °The testing procedure may vary among health care providers and hospitals. °How are the results reported? °Your results will be reported as milligrams of glucose per deciliter of blood (mg/dL) or millimoles per liter (mmol/L). There is more than one source for screening and diagnosis reference values used to diagnose gestational diabetes. Your health care provider will compare your results to normal values that were established after testing a large group of people (reference values). Reference values may vary among labs and hospitals. For this test (Carpenter-Coustan), reference values are: °Fasting: 95 mg/dL (5.3 mmol/L). °1 hour: 180 mg/dL (10.0 mmol/L). °2 hour: 155 mg/dL (8.6 mmol/L). °3 hour: 140 mg/dL (7.8 mmol/L). °What do the results mean? °Results below the reference values are considered   normal. If two or more of your blood glucose levels are at or above the reference values, you may be diagnosed with gestational diabetes. If only one level is high, your health care provider may suggest  repeat testing or other tests to confirm a diagnosis. °Talk with your health care provider about what your results mean. °Questions to ask your health care provider °Ask your health care provider, or the department that is doing the test: °When will my results be ready? °How will I get my results? °What are my treatment options? °What other tests do I need? °What are my next steps? °Summary °The oral glucose tolerance test (OGTT) is one of several tests used to diagnose diabetes that develops during pregnancy (gestational diabetes mellitus). Gestational diabetes is a short-term form of diabetes that some women develop while they are pregnant. °You may have the OGTT test after having a 1-hour glucose screening test if the results from that test show that you may have gestational diabetes. You may also have this test if you have any symptoms or risk factors for this type of diabetes. °Talk with your health care provider about what your results mean. °This information is not intended to replace advice given to you by your health care provider. Make sure you discuss any questions you have with your health care provider. °Document Revised: 05/22/2020 Document Reviewed: 05/22/2020 °Elsevier Patient Education © 2022 Elsevier Inc. ° °

## 2022-04-06 NOTE — Progress Notes (Signed)
ROB , pt transferred to midwifery service is interested in water birth. Discussed completion of on line class. She is to bring in certificate once completed.. Feels good movement. 28 wk labs today: Glucose screen/RPR/CBC. Tdap declined, Blood transfusion consent completed, all questions answered. Ready set baby reviewed, see check list for topics covered. Sample birth plan given, will follow up in upcoming visits. Discussed birth control after delivery, information pamphlet given.  ? ?Follow up 2 wk with Sharyn Lull for [place] or sooner if needed.  ? ? ?Philip Aspen, CNM ?

## 2022-04-07 ENCOUNTER — Other Ambulatory Visit: Payer: Self-pay | Admitting: Certified Nurse Midwife

## 2022-04-07 ENCOUNTER — Encounter: Payer: Self-pay | Admitting: Certified Nurse Midwife

## 2022-04-07 LAB — GLUCOSE, 1 HOUR GESTATIONAL: Gestational Diabetes Screen: 123 mg/dL (ref 70–139)

## 2022-04-07 LAB — CBC
Hematocrit: 31.1 % — ABNORMAL LOW (ref 34.0–46.6)
Hemoglobin: 10.2 g/dL — ABNORMAL LOW (ref 11.1–15.9)
MCH: 27.7 pg (ref 26.6–33.0)
MCHC: 32.8 g/dL (ref 31.5–35.7)
MCV: 85 fL (ref 79–97)
Platelets: 264 10*3/uL (ref 150–450)
RBC: 3.68 x10E6/uL — ABNORMAL LOW (ref 3.77–5.28)
RDW: 15.1 % (ref 11.7–15.4)
WBC: 8.6 10*3/uL (ref 3.4–10.8)

## 2022-04-07 LAB — RPR: RPR Ser Ql: NONREACTIVE

## 2022-04-07 MED ORDER — FUSION PLUS PO CAPS: 1.0000 | ORAL_CAPSULE | Freq: Every day | ORAL | 11 refills | Status: DC

## 2022-04-13 ENCOUNTER — Telehealth: Payer: Self-pay | Admitting: Certified Nurse Midwife

## 2022-04-13 NOTE — Telephone Encounter (Signed)
Pt called stating the Pharmacy would not fill the RX Iron-FA_B cmp C-Biot- Probiotic tablet. Pt is asking if there is a generic that insurance will cover and if you could send in the RX ?

## 2022-04-18 ENCOUNTER — Other Ambulatory Visit: Payer: Self-pay | Admitting: Certified Nurse Midwife

## 2022-04-18 DIAGNOSIS — Z3483 Encounter for supervision of other normal pregnancy, third trimester: Secondary | ICD-10-CM

## 2022-04-18 DIAGNOSIS — L299 Pruritus, unspecified: Secondary | ICD-10-CM

## 2022-04-18 DIAGNOSIS — Z3403 Encounter for supervision of normal first pregnancy, third trimester: Secondary | ICD-10-CM

## 2022-04-21 ENCOUNTER — Ambulatory Visit (INDEPENDENT_AMBULATORY_CARE_PROVIDER_SITE_OTHER): Payer: Medicaid Other | Admitting: Obstetrics

## 2022-04-21 VITALS — BP 114/75 | HR 101 | Wt 184.0 lb

## 2022-04-21 DIAGNOSIS — Z3A30 30 weeks gestation of pregnancy: Secondary | ICD-10-CM

## 2022-04-21 DIAGNOSIS — Z3403 Encounter for supervision of normal first pregnancy, third trimester: Secondary | ICD-10-CM

## 2022-04-21 DIAGNOSIS — Z3483 Encounter for supervision of other normal pregnancy, third trimester: Secondary | ICD-10-CM

## 2022-04-21 DIAGNOSIS — L299 Pruritus, unspecified: Secondary | ICD-10-CM

## 2022-04-21 LAB — POCT URINALYSIS DIPSTICK OB
Glucose, UA: NEGATIVE
POC,PROTEIN,UA: NEGATIVE

## 2022-04-21 NOTE — Progress Notes (Signed)
ROB at [redacted]w[redacted]d. Baby had not moved much today but became active during the appointment. Having a lot of BH ctx. Denies vaginal bleeding and LOF. Freddy has noticed a flare up of eczema on the back of her hands and itching on her arms, legs, and abdomen without a rash. It is worse in the shower. Encouraged Eucerin/Aquaphor for hands and mild lotion such as Aveeno for arms and legs. Cholestasis labs drawn today. Reviewed 1-hour glucose results. RTC in 2 weeks.  ? ?Guadlupe Spanish, CNM ?

## 2022-04-22 ENCOUNTER — Encounter: Payer: Self-pay | Admitting: Obstetrics

## 2022-04-22 ENCOUNTER — Encounter: Payer: Self-pay | Admitting: Certified Nurse Midwife

## 2022-04-22 LAB — COMPREHENSIVE METABOLIC PANEL
ALT: 12 IU/L (ref 0–32)
AST: 16 IU/L (ref 0–40)
Albumin/Globulin Ratio: 1.2 (ref 1.2–2.2)
Albumin: 3.3 g/dL — ABNORMAL LOW (ref 3.8–4.8)
Alkaline Phosphatase: 62 IU/L (ref 44–121)
BUN/Creatinine Ratio: 14 (ref 9–23)
BUN: 8 mg/dL (ref 6–20)
Bilirubin Total: 0.3 mg/dL (ref 0.0–1.2)
CO2: 18 mmol/L — ABNORMAL LOW (ref 20–29)
Calcium: 9.1 mg/dL (ref 8.7–10.2)
Chloride: 107 mmol/L — ABNORMAL HIGH (ref 96–106)
Creatinine, Ser: 0.58 mg/dL (ref 0.57–1.00)
Globulin, Total: 2.7 g/dL (ref 1.5–4.5)
Glucose: 96 mg/dL (ref 70–99)
Potassium: 4.2 mmol/L (ref 3.5–5.2)
Sodium: 140 mmol/L (ref 134–144)
Total Protein: 6 g/dL (ref 6.0–8.5)
eGFR: 123 mL/min/{1.73_m2} (ref >59)

## 2022-04-22 LAB — BILE ACIDS, TOTAL: Bile Acids Total: 5.7 umol/L (ref 0.0–10.0)

## 2022-05-05 ENCOUNTER — Encounter: Payer: Self-pay | Admitting: Certified Nurse Midwife

## 2022-05-05 ENCOUNTER — Ambulatory Visit (INDEPENDENT_AMBULATORY_CARE_PROVIDER_SITE_OTHER): Payer: Medicaid Other | Admitting: Certified Nurse Midwife

## 2022-05-05 VITALS — BP 113/74 | HR 97 | Wt 186.5 lb

## 2022-05-05 DIAGNOSIS — Z3403 Encounter for supervision of normal first pregnancy, third trimester: Secondary | ICD-10-CM

## 2022-05-05 LAB — POCT URINALYSIS DIPSTICK OB
Bilirubin, UA: NEGATIVE
Blood, UA: NEGATIVE
Glucose, UA: NEGATIVE
Ketones, UA: NEGATIVE
Leukocytes, UA: NEGATIVE
Nitrite, UA: NEGATIVE
Spec Grav, UA: 1.01 (ref 1.010–1.025)
Urobilinogen, UA: 0.2 E.U./dL
pH, UA: 7 (ref 5.0–8.0)

## 2022-05-05 NOTE — Progress Notes (Signed)
ROB doing well, feeling good movement, She c/o occasional braxton hick ctx. Has water birth class scheduled for 5/18. ACNM and Acog position statements given to pt. Copy of consent given to her to review. PTL precautions reviewed. Follow up 2 wk for ROB with Missy.  ? ?Philip Aspen, CNM  ?

## 2022-05-05 NOTE — Patient Instructions (Signed)
[facility]  [facility]  [address], [place], [place] [address]  Phone: [phone]  [facility] (second location)  [address] [place], [place] [address]  Phone: [phone]  [facility] ([place]) [address], [place], [place] [address] Phone: [phone]  [facility]  [address] [place], [place] [address]  Phone: [phone] 

## 2022-05-18 ENCOUNTER — Encounter: Payer: Self-pay | Admitting: Orthopaedic Surgery

## 2022-05-18 ENCOUNTER — Telehealth: Payer: Self-pay | Admitting: Certified Nurse Midwife

## 2022-05-18 ENCOUNTER — Other Ambulatory Visit: Payer: Self-pay

## 2022-05-18 ENCOUNTER — Observation Stay
Admission: EM | Admit: 2022-05-18 | Discharge: 2022-05-18 | Disposition: A | Discharge status: Home / Self Care | Payer: Medicaid Other | Attending: Obstetrics | Admitting: Obstetrics

## 2022-05-18 DIAGNOSIS — R82998 Other abnormal findings in urine: Secondary | ICD-10-CM | POA: Diagnosis not present

## 2022-05-18 DIAGNOSIS — O4193X0 Disorder of amniotic fluid and membranes, unspecified, third trimester, not applicable or unspecified: Secondary | ICD-10-CM | POA: Diagnosis present

## 2022-05-18 DIAGNOSIS — Z3A34 34 weeks gestation of pregnancy: Secondary | ICD-10-CM | POA: Insufficient documentation

## 2022-05-18 DIAGNOSIS — O99891 Other specified diseases and conditions complicating pregnancy: Secondary | ICD-10-CM | POA: Diagnosis not present

## 2022-05-18 LAB — URINALYSIS, ROUTINE W REFLEX MICROSCOPIC
Bilirubin Urine: NEGATIVE
Glucose, UA: NEGATIVE mg/dL
Hgb urine dipstick: NEGATIVE
Ketones, ur: NEGATIVE mg/dL
Nitrite: NEGATIVE
Protein, ur: NEGATIVE mg/dL
Specific Gravity, Urine: 1.016 (ref 1.005–1.030)
pH: 7 (ref 5.0–8.0)

## 2022-05-18 LAB — RUPTURE OF MEMBRANE (ROM)PLUS: Rom Plus: NEGATIVE

## 2022-05-18 LAB — WET PREP, GENITAL
Clue Cells Wet Prep HPF POC: NONE SEEN
Sperm: NONE SEEN
Trich, Wet Prep: NONE SEEN
WBC, Wet Prep HPF POC: <10 (ref <10)
Yeast Wet Prep HPF POC: NONE SEEN

## 2022-05-18 NOTE — OB Triage Note (Signed)
LABOR & DELIVERY OB TRIAGE NOTE  SUBJECTIVE  HPI Natalie Scott is a 31 y.o. [id number] at [redacted]w[redacted]d who presents to Labor & Delivery for leaking of fluid. She denies contractions and vaginal bleeding. She endorses good fetal movement. She reports that she felt some fluid leaking last night and this AM her underwear was wet. She denies urinary symptoms or other concerns.  OB History     Gravida  4   Para  2   Term  2   Preterm  0   AB  1   Living  2      SAB  0   IAB  0   Ectopic  0   Multiple  0   Live Births  2           Scheduled Meds: Continuous Infusions: PRN Meds:.  OBJECTIVE  BP 118/71 (BP Location: Right Arm)   Pulse (!) 104   Temp 98 F (36.7 C) (Oral)   Resp 16   Ht 5\' 8"  (1.727 m)   Wt 84.8 kg   LMP 09/20/2021   BMI 28.43 kg/m   NST I reviewed the NST and it was reactive.  Baseline: 150 Variability: moderate Accelerations: present Decelerations:none Toco: no ctx Category 1  ROM Plus negative Wet prep negative Moderate leuks and rare bacteria in UA - UC sent  ASSESSMENT Impression  1) Pregnancy at [id number], [redacted]w[redacted]d, Estimated Date of Delivery: 06/27/22 2) Reassuring maternal/fetal status 3) Membranes intact  PLAN  Discharge home with standard labor/return precautions Keep scheduled ROB appt  Lloyd Huger, CNM

## 2022-05-18 NOTE — OB Triage Note (Addendum)
Pt is a 30yo [id number] at [redacted]w[redacted]d that presents with c/o leaking clear fluid since yesterday morning. Pt states she has had some occasional braxton hicks ctx but nothing consistent and last intercourse was Saturday. Pt denies VB and states positive FM. EFM applied and assessing.

## 2022-05-18 NOTE — OB Triage Note (Signed)
Patient Discharged home per Provider. Pt educated about labor precautions and informed when to return to the ED for further evaluation. Pt instructed to keep all follow up appointments with her provider. AVS given to patient and RN answered all questions and patient has no further questions at this time. Pt discharged home in stable condition.

## 2022-05-18 NOTE — Telephone Encounter (Signed)
Pt called she is 34 weeks and is concerned about a watery leakage that started yesterday- she denies any odor or color to leakage- pt states a small amount of pian in stomach "not bad"- I consulted clinical staff and advised pt to go to ER to be evaluated. Pt understood.

## 2022-05-19 LAB — URINE CULTURE: Culture: NO GROWTH

## 2022-05-20 ENCOUNTER — Encounter: Payer: Self-pay | Admitting: Obstetrics

## 2022-05-20 ENCOUNTER — Ambulatory Visit (INDEPENDENT_AMBULATORY_CARE_PROVIDER_SITE_OTHER): Payer: Medicaid Other | Admitting: Obstetrics

## 2022-05-20 VITALS — BP 116/75 | HR 96 | Wt 187.1 lb

## 2022-05-20 DIAGNOSIS — Z3483 Encounter for supervision of other normal pregnancy, third trimester: Secondary | ICD-10-CM

## 2022-05-20 LAB — POCT URINALYSIS DIPSTICK OB
Bilirubin, UA: NEGATIVE
Blood, UA: NEGATIVE
Glucose, UA: NEGATIVE
Ketones, UA: NEGATIVE
Leukocytes, UA: NEGATIVE
Nitrite, UA: NEGATIVE
POC,PROTEIN,UA: NEGATIVE
Spec Grav, UA: 1.01 (ref 1.010–1.025)
Urobilinogen, UA: 0.2 E.U./dL
pH, UA: 7 (ref 5.0–8.0)

## 2022-05-20 NOTE — Progress Notes (Signed)
ROB at [redacted]w[redacted]d. Active baby. Denies LOF, vaginal bleeding.  Feeling exhausted; not able to sleep well at night. Discussed sleep hygiene, meditation, Unisom PRN. Having frequent runs of contractions at night and in the AM. Valor does not think she is drinking enough water but is trying to increase. Only gets 2 15 minute breaks at work, and using the restroom counts against this time. Hoping to go out of work 06/17/22. Had to miss water birth class; rescheduled for 6/18. Discussed breast feeding and how to increase milk supply. Reviewed GBS and GC/chlamydia at next visit. Know when/how to go to L&D.  RTC in 2 weeks.  Guadlupe Spanish, CNM

## 2022-06-03 ENCOUNTER — Encounter: Payer: Self-pay | Admitting: Obstetrics

## 2022-06-03 ENCOUNTER — Ambulatory Visit (INDEPENDENT_AMBULATORY_CARE_PROVIDER_SITE_OTHER): Payer: Medicaid Other | Admitting: Obstetrics

## 2022-06-03 VITALS — BP 115/78 | HR 97 | Wt 190.6 lb

## 2022-06-03 DIAGNOSIS — Z3A36 36 weeks gestation of pregnancy: Secondary | ICD-10-CM

## 2022-06-03 LAB — POCT URINALYSIS DIPSTICK
Bilirubin, UA: NEGATIVE
Blood, UA: NEGATIVE
Glucose, UA: NEGATIVE
Ketones, UA: NEGATIVE
Leukocytes, UA: NEGATIVE
Nitrite, UA: NEGATIVE
Protein, UA: NEGATIVE
Spec Grav, UA: 1.01 (ref 1.010–1.025)
Urobilinogen, UA: 0.2 E.U./dL
pH, UA: 6 (ref 5.0–8.0)

## 2022-06-03 NOTE — Addendum Note (Signed)
Addended by: Loney Laurence on: 06/03/2022 08:53 AM   Modules accepted: Orders

## 2022-06-03 NOTE — Progress Notes (Signed)
ROB at [redacted]w[redacted]d. Active baby. Denies regular ctx, LOF, vaginal bleeding. Many social stressors, including unsupportive FOB. Has good support system. Having discomfort from carpal tunnel at night. Recommend wrist splint, exercise. Reviewed when to go to the hospital. GBS and GC/chlamydia collected. RTC in one week.  Guadlupe Spanish, CNM

## 2022-06-05 LAB — STREP GP B NAA: Strep Gp B NAA: POSITIVE — AB

## 2022-06-07 ENCOUNTER — Ambulatory Visit (INDEPENDENT_AMBULATORY_CARE_PROVIDER_SITE_OTHER): Payer: Medicaid Other | Admitting: Obstetrics

## 2022-06-07 VITALS — BP 107/72 | HR 111 | Wt 191.9 lb

## 2022-06-07 DIAGNOSIS — Z3483 Encounter for supervision of other normal pregnancy, third trimester: Secondary | ICD-10-CM

## 2022-06-07 LAB — POCT URINALYSIS DIPSTICK OB
Bilirubin, UA: NEGATIVE
Blood, UA: NEGATIVE
Glucose, UA: NEGATIVE
Ketones, UA: NEGATIVE
Leukocytes, UA: NEGATIVE
Nitrite, UA: NEGATIVE
POC,PROTEIN,UA: NEGATIVE
Spec Grav, UA: 1.015 (ref 1.010–1.025)
Urobilinogen, UA: 0.2 E.U./dL
pH, UA: 6.5 (ref 5.0–8.0)

## 2022-06-07 LAB — GC/CHLAMYDIA PROBE AMP
Chlamydia trachomatis, NAA: NEGATIVE
Neisseria Gonorrhoeae by PCR: NEGATIVE

## 2022-06-07 NOTE — Progress Notes (Addendum)
ROB at [redacted]w[redacted]d. Active baby. Having a few runs of contractions but nothing regular yet. Visibly exhausted. Not sleeping well at night and has little support with household tasks. Considering going out from work earlier if she can get FMLA approved. Wrist splint helping some with tingling in hands. Requests SVE: FT/thick/high. Reviewed GBS positive results and recommendation for abx in labor. RTC in one week.  Guadlupe Spanish, CNM

## 2022-06-11 ENCOUNTER — Telehealth: Payer: Self-pay | Admitting: Obstetrics

## 2022-06-11 NOTE — Telephone Encounter (Signed)
Patient called and states she need her dates to be updated on her FMLA forms. She would like the first day of leave to be on 06/14/2022. Please advise.

## 2022-06-11 NOTE — Telephone Encounter (Signed)
Will update dates per pt. request

## 2022-06-16 ENCOUNTER — Encounter: Payer: Self-pay | Admitting: Obstetrics and Gynecology

## 2022-06-16 ENCOUNTER — Ambulatory Visit (INDEPENDENT_AMBULATORY_CARE_PROVIDER_SITE_OTHER): Payer: Medicaid Other | Admitting: Obstetrics and Gynecology

## 2022-06-16 VITALS — BP 113/75 | HR 103 | Wt 193.5 lb

## 2022-06-16 DIAGNOSIS — Z3483 Encounter for supervision of other normal pregnancy, third trimester: Secondary | ICD-10-CM | POA: Diagnosis not present

## 2022-06-16 DIAGNOSIS — Z3A38 38 weeks gestation of pregnancy: Secondary | ICD-10-CM | POA: Diagnosis not present

## 2022-06-16 LAB — POCT URINALYSIS DIPSTICK OB
Bilirubin, UA: NEGATIVE
Blood, UA: NEGATIVE
Glucose, UA: NEGATIVE
Ketones, UA: NEGATIVE
Leukocytes, UA: NEGATIVE
Nitrite, UA: NEGATIVE
Spec Grav, UA: 1.015 (ref 1.010–1.025)
Urobilinogen, UA: 0.2 E.U./dL
pH, UA: 7 (ref 5.0–8.0)

## 2022-06-16 NOTE — Progress Notes (Signed)
ROB: Denies problems.  Reports occasional irregular contractions.  Daily fetal movement.

## 2022-06-16 NOTE — Progress Notes (Signed)
ROB. Patient states fetal movement with occasional braxton hicks. She reports still having numbness and tingling in her right hand and arm, wrist splints offer minimal help. Patient states her continued desire for a water birth. Patient states no questions or concerns at this time.

## 2022-06-24 ENCOUNTER — Ambulatory Visit (INDEPENDENT_AMBULATORY_CARE_PROVIDER_SITE_OTHER): Payer: Medicaid Other | Admitting: Certified Nurse Midwife

## 2022-06-24 VITALS — BP 121/77 | HR 96 | Wt 193.7 lb

## 2022-06-24 DIAGNOSIS — Z3483 Encounter for supervision of other normal pregnancy, third trimester: Secondary | ICD-10-CM

## 2022-06-24 LAB — POCT URINALYSIS DIPSTICK OB
Bilirubin, UA: NEGATIVE
Blood, UA: NEGATIVE
Glucose, UA: NEGATIVE
Ketones, UA: NEGATIVE
Leukocytes, UA: NEGATIVE
Nitrite, UA: NEGATIVE
POC,PROTEIN,UA: NEGATIVE
Spec Grav, UA: 1.02 (ref 1.010–1.025)
Urobilinogen, UA: 0.2 E.U./dL
pH, UA: 6.5 (ref 5.0–8.0)

## 2022-06-24 NOTE — Progress Notes (Signed)
ROB doing well, feeling good movement. Labor precautions reviewed. SVE per pt request 2/50/-2. Discussed water birth. Pt is not going to move forward with water birth due to not having a tub.  Follow up 1 wk for NST and ROB.   Doreene Burke, CNM

## 2022-06-24 NOTE — Patient Instructions (Signed)
Braxton Hicks Contractions  Contractions of the uterus can occur throughout pregnancy, but they are not always a sign that you are in labor. You may have practice contractions called Braxton Hicks contractions. These false labor contractions are sometimes confused with true labor. What are Braxton Hicks contractions? Braxton Hicks contractions are tightening movements that occur in the muscles of the uterus before labor. Unlike true labor contractions, these contractions do not result in opening (dilation) and thinning of the lowest part of the uterus (cervix). Toward the end of pregnancy (32-34 weeks), Braxton Hicks contractions can happen more often and may become stronger. These contractions are sometimes difficult to tell apart from true labor because they can be very uncomfortable. How to tell the difference between true labor and false labor True labor Contractions last 30-70 seconds. Contractions become very regular. Discomfort is usually felt in the top of the uterus, and it spreads to the lower abdomen and low back. Contractions do not go away with walking. Contractions usually become stronger and more frequent. The cervix dilates and gets thinner. False labor Contractions are usually shorter, weaker, and farther apart than true labor contractions. Contractions are usually irregular. Contractions are often felt in the front of the lower abdomen and in the groin. Contractions may go away when you walk around or change positions while lying down. The cervix usually does not dilate or become thin. Sometimes, the only way to tell if you are in true labor is for your health care provider to look for changes in your cervix. Your health care provider will do a physical exam and may monitor your contractions. If you are in true labor, your health care provider will send you home with instructions about when to return to the hospital. You may continue to have Braxton Hicks contractions until you  go into true labor. Follow these instructions at home:  Take over-the-counter and prescription medicines only as told by your health care provider. If Braxton Hicks contractions are making you uncomfortable: Change your position from lying down or resting to walking, or change from walking to resting. Sit and rest in a tub of warm water. Drink enough fluid to keep your urine pale yellow. Dehydration may cause these contractions. Do slow and deep breathing several times an hour. Keep all follow-up visits. This is important. Contact a health care provider if: You have a fever. You have continuous pain in your abdomen. Your contractions become stronger, more regular, and closer together. You pass blood-tinged mucus. Get help right away if: You have fluid leaking or gushing from your vagina. You have bright red blood coming from your vagina. Your baby is not moving inside you as much as it used to. Summary You may have practice contractions called Braxton Hicks contractions. These false labor contractions are sometimes confused with true labor. Braxton Hicks contractions are usually shorter, weaker, farther apart, and less regular than true labor contractions. True labor contractions usually become stronger, more regular, and more frequent. Manage discomfort from Braxton Hicks contractions by changing position, resting in a warm bath, practicing deep breathing, and drinking plenty of water. Keep all follow-up visits. Contact your health care provider if your contractions become stronger, more regular, and closer together. This information is not intended to replace advice given to you by your health care provider. Make sure you discuss any questions you have with your health care provider. Document Revised: 10/20/2020 Document Reviewed: 10/20/2020 Elsevier Patient Education  2023 Elsevier Inc.  

## 2022-06-29 ENCOUNTER — Observation Stay
Admission: EM | Admit: 2022-06-29 | Discharge: 2022-06-29 | Disposition: A | Discharge status: Home / Self Care | Payer: Medicaid Other | Source: Home / Self Care | Admitting: Certified Nurse Midwife

## 2022-06-29 ENCOUNTER — Other Ambulatory Visit: Payer: Self-pay

## 2022-06-29 ENCOUNTER — Encounter: Payer: Self-pay | Admitting: Obstetrics and Gynecology

## 2022-06-29 DIAGNOSIS — Z3490 Encounter for supervision of normal pregnancy, unspecified, unspecified trimester: Secondary | ICD-10-CM

## 2022-06-29 DIAGNOSIS — R109 Unspecified abdominal pain: Secondary | ICD-10-CM | POA: Diagnosis not present

## 2022-06-29 DIAGNOSIS — O480 Post-term pregnancy: Secondary | ICD-10-CM | POA: Insufficient documentation

## 2022-06-29 DIAGNOSIS — Z3A40 40 weeks gestation of pregnancy: Secondary | ICD-10-CM | POA: Insufficient documentation

## 2022-06-29 DIAGNOSIS — O471 False labor at or after 37 completed weeks of gestation: Secondary | ICD-10-CM | POA: Insufficient documentation

## 2022-06-29 DIAGNOSIS — O26893 Other specified pregnancy related conditions, third trimester: Secondary | ICD-10-CM | POA: Diagnosis not present

## 2022-06-29 DIAGNOSIS — Z79899 Other long term (current) drug therapy: Secondary | ICD-10-CM | POA: Insufficient documentation

## 2022-06-29 MED ORDER — ONDANSETRON 4 MG PO TBDP: 4.0000 mg | ORAL_TABLET | Freq: Once | ORAL | Status: DC

## 2022-06-29 MED ORDER — ONDANSETRON 4 MG PO TBDP: 4.0000 mg | ORAL_TABLET | Freq: Once | ORAL | 0 refills | Status: AC

## 2022-06-29 MED ORDER — ACETAMINOPHEN 325 MG PO TABS: 650.0000 mg | ORAL_TABLET | ORAL | Status: DC | PRN

## 2022-06-29 NOTE — OB Triage Note (Signed)
Patient is a [id number] at [redacted]w[redacted]d who presents to unit c/o ctx about 10 min apart for the past 2 hours. Patient reports sexual intercourse this am and watery, clear discharge. Reports +FM, denies vaginal bleeding. External monitors applied and assessing. Initial FHT 150. Vital signs WDL.

## 2022-06-29 NOTE — OB Triage Note (Signed)
    L&D OB Triage Note  SUBJECTIVE Natalie Scott is a 31 y.o. [id number] female at [redacted]w[redacted]d, EDD Estimated Date of Delivery: 06/27/22 who presented to triage with complaints of contractions. State she had intercourse this morning. She denies loss of fluid , vaginal bleeding and is feeling good movement.   OB History  Gravida Para Term Preterm AB Living  4 2 2  0 1 2  SAB IAB Ectopic Multiple Live Births  0 0 0 0 2    # Outcome Date GA Lbr Len/2nd Weight Sex Delivery Anes PTL Lv  4 Current           3 Term 01/09/11 [redacted]w[redacted]d  3629 g F Vag-Spont  N LIV  2 Term 01/09/11    F    LIV  1 AB             Facility-Administered Medications Prior to Admission  Medication Dose Route Frequency Provider Last Rate Last Admin   Tdap (BOOSTRIX) injection 0.5 mL  0.5 mL Intramuscular Once 01/11/11, CNM       Medications Prior to Admission  Medication Sig Dispense Refill Last Dose   Iron-FA-B Cmp-C-Biot-Probiotic (FUSION PLUS) CAPS Take 1 tablet by mouth daily. 30 capsule 11    Prenat-FeAsp-Meth-FA-DHA w/o A (PRENATE PIXIE) 10-0.6-0.4-200 MG CAPS Take 1 capsule by mouth daily.        OBJECTIVE  Nursing Evaluation:   BP 118/74 (BP Location: Left Arm)   Pulse 98   Temp 99 F (37.2 C) (Oral)   Resp 20   Ht 5\' 8"  (1.727 m)   Wt 87.5 kg   LMP 09/20/2021   BMI 29.35 kg/m    Findings:   no cervical change x 2 hrs      NST was performed and has been reviewed by me.  NST INTERPRETATION: Category I  Mode: External Baseline Rate (A): 155 bpm Variability: Moderate Accelerations: 15 x 15 Decelerations: None     Contraction Frequency (min): 1-5  ASSESSMENT Impression:  1.  Pregnancy:  at [redacted]w[redacted]d , EDD Estimated Date of Delivery: 06/27/22 2.  Reassuring fetal and maternal status 3.  False labor  PLAN 1. Current condition and above findings reviewed.  Reassuring fetal and maternal condition. 2. Discharge home with standard labor precautions given to return to L&D or call the office for  problems. 3. Continue routine prenatal care.     [redacted]w[redacted]d, CNM

## 2022-06-29 NOTE — OB Triage Note (Signed)
Discharge instructions, labor precautions, and follow-up care reviewed with patient. All questions answered. Patient verbalized understanding. Discharged ambulatory off unit.   

## 2022-06-30 ENCOUNTER — Other Ambulatory Visit: Payer: Self-pay

## 2022-06-30 ENCOUNTER — Inpatient Hospital Stay
Admission: EM | Admit: 2022-06-30 | Discharge: 2022-07-02 | DRG: 806 | Disposition: A | Discharge status: Home / Self Care | Payer: Medicaid Other | Attending: Advanced Practice Midwife | Admitting: Advanced Practice Midwife

## 2022-06-30 ENCOUNTER — Ambulatory Visit (INDEPENDENT_AMBULATORY_CARE_PROVIDER_SITE_OTHER): Payer: Medicaid Other | Admitting: Obstetrics

## 2022-06-30 ENCOUNTER — Encounter: Payer: Self-pay | Admitting: Obstetrics and Gynecology

## 2022-06-30 VITALS — BP 100/70 | HR 90 | Wt 193.7 lb

## 2022-06-30 DIAGNOSIS — O99324 Drug use complicating childbirth: Secondary | ICD-10-CM | POA: Diagnosis present

## 2022-06-30 DIAGNOSIS — F32A Depression, unspecified: Secondary | ICD-10-CM

## 2022-06-30 DIAGNOSIS — O0933 Supervision of pregnancy with insufficient antenatal care, third trimester: Secondary | ICD-10-CM

## 2022-06-30 DIAGNOSIS — O99824 Streptococcus B carrier state complicating childbirth: Secondary | ICD-10-CM | POA: Diagnosis present

## 2022-06-30 DIAGNOSIS — O09293 Supervision of pregnancy with other poor reproductive or obstetric history, third trimester: Secondary | ICD-10-CM | POA: Diagnosis not present

## 2022-06-30 DIAGNOSIS — F1290 Cannabis use, unspecified, uncomplicated: Secondary | ICD-10-CM | POA: Diagnosis present

## 2022-06-30 DIAGNOSIS — O480 Post-term pregnancy: Principal | ICD-10-CM | POA: Diagnosis present

## 2022-06-30 DIAGNOSIS — O9902 Anemia complicating childbirth: Secondary | ICD-10-CM

## 2022-06-30 DIAGNOSIS — O99344 Other mental disorders complicating childbirth: Secondary | ICD-10-CM | POA: Diagnosis not present

## 2022-06-30 DIAGNOSIS — Z3483 Encounter for supervision of other normal pregnancy, third trimester: Secondary | ICD-10-CM

## 2022-06-30 DIAGNOSIS — Z3A40 40 weeks gestation of pregnancy: Secondary | ICD-10-CM

## 2022-06-30 DIAGNOSIS — F1210 Cannabis abuse, uncomplicated: Secondary | ICD-10-CM

## 2022-06-30 DIAGNOSIS — D649 Anemia, unspecified: Secondary | ICD-10-CM

## 2022-06-30 DIAGNOSIS — F5089 Other specified eating disorder: Secondary | ICD-10-CM

## 2022-06-30 DIAGNOSIS — O99343 Other mental disorders complicating pregnancy, third trimester: Secondary | ICD-10-CM

## 2022-06-30 DIAGNOSIS — O99323 Drug use complicating pregnancy, third trimester: Secondary | ICD-10-CM

## 2022-06-30 DIAGNOSIS — O288 Other abnormal findings on antenatal screening of mother: Secondary | ICD-10-CM | POA: Diagnosis present

## 2022-06-30 DIAGNOSIS — O99820 Streptococcus B carrier state complicating pregnancy: Secondary | ICD-10-CM

## 2022-06-30 DIAGNOSIS — O368330 Maternal care for abnormalities of the fetal heart rate or rhythm, third trimester, not applicable or unspecified: Secondary | ICD-10-CM

## 2022-06-30 DIAGNOSIS — F5083 Pica in adults: Secondary | ICD-10-CM

## 2022-06-30 DIAGNOSIS — O99013 Anemia complicating pregnancy, third trimester: Secondary | ICD-10-CM

## 2022-06-30 LAB — POCT URINALYSIS DIPSTICK OB
Bilirubin, UA: NEGATIVE
Blood, UA: NEGATIVE
Glucose, UA: NEGATIVE
Leukocytes, UA: NEGATIVE
Nitrite, UA: NEGATIVE
POC,PROTEIN,UA: NEGATIVE
Urobilinogen, UA: 0.2 E.U./dL

## 2022-06-30 LAB — CBC
HCT: 29.7 % — ABNORMAL LOW (ref 36.0–46.0)
Hemoglobin: 9.5 g/dL — ABNORMAL LOW (ref 12.0–15.0)
MCH: 26 pg (ref 26.0–34.0)
MCHC: 32 g/dL (ref 30.0–36.0)
MCV: 81.1 fL (ref 80.0–100.0)
Platelets: 236 10*3/uL (ref 150–400)
RBC: 3.66 MIL/uL — ABNORMAL LOW (ref 3.87–5.11)
RDW: 15.9 % — ABNORMAL HIGH (ref 11.5–15.5)
WBC: 6 10*3/uL (ref 4.0–10.5)
nRBC: 0 % (ref 0.0–0.2)

## 2022-06-30 LAB — TYPE AND SCREEN
ABO/RH(D): A POS
Antibody Screen: NEGATIVE

## 2022-06-30 MED ORDER — LACTATED RINGERS IV SOLN: INTRAVENOUS | Status: DC

## 2022-06-30 MED ORDER — LIDOCAINE HCL (PF) 1 % IJ SOLN: 30.0000 mL | INTRAMUSCULAR | Status: DC | PRN

## 2022-06-30 MED ORDER — CALCIUM CARBONATE ANTACID 500 MG PO CHEW
400.0000 mg | CHEWABLE_TABLET | ORAL | Status: DC | PRN
  Administered 2022-06-30: 400 mg via ORAL
  Filled 2022-06-30: qty 2

## 2022-06-30 MED ORDER — OXYTOCIN-SODIUM CHLORIDE 30-0.9 UT/500ML-% IV SOLN
INTRAVENOUS | Status: AC
  Administered 2022-06-30: 2 m[IU]/min via INTRAVENOUS
  Filled 2022-06-30: qty 500

## 2022-06-30 MED ORDER — AMMONIA AROMATIC IN INHA
RESPIRATORY_TRACT | Status: AC
  Filled 2022-06-30: qty 10

## 2022-06-30 MED ORDER — SODIUM CHLORIDE 0.9 % IV SOLN
5.0000 10*6.[IU] | Freq: Once | INTRAVENOUS | Status: AC
  Administered 2022-06-30: 5 10*6.[IU] via INTRAVENOUS
  Filled 2022-06-30: qty 5

## 2022-06-30 MED ORDER — OXYTOCIN 10 UNIT/ML IJ SOLN
INTRAMUSCULAR | Status: AC
  Filled 2022-06-30: qty 2

## 2022-06-30 MED ORDER — MISOPROSTOL 200 MCG PO TABS
ORAL_TABLET | ORAL | Status: AC
  Filled 2022-06-30: qty 4

## 2022-06-30 MED ORDER — LIDOCAINE HCL (PF) 1 % IJ SOLN
INTRAMUSCULAR | Status: AC
  Filled 2022-06-30: qty 30

## 2022-06-30 MED ORDER — PENICILLIN G POT IN DEXTROSE 60000 UNIT/ML IV SOLN
3.0000 10*6.[IU] | INTRAVENOUS | Status: DC
  Administered 2022-07-01 (×2): 3 10*6.[IU] via INTRAVENOUS
  Filled 2022-06-30 (×2): qty 50

## 2022-06-30 MED ORDER — OXYTOCIN BOLUS FROM INFUSION
333.0000 mL | Freq: Once | INTRAVENOUS | Status: AC
  Administered 2022-07-01: 333 mL via INTRAVENOUS

## 2022-06-30 MED ORDER — LACTATED RINGERS IV SOLN: 500.0000 mL | INTRAVENOUS | Status: DC | PRN

## 2022-06-30 MED ORDER — ACETAMINOPHEN 325 MG PO TABS
650.0000 mg | ORAL_TABLET | ORAL | Status: DC | PRN
Start: 2022-06-30 — End: 2022-07-01
  Filled 2022-06-30 (×2): qty 2

## 2022-06-30 MED ORDER — MISOPROSTOL 25 MCG QUARTER TABLET
ORAL_TABLET | ORAL | Status: AC
  Filled 2022-06-30: qty 1

## 2022-06-30 MED ORDER — ONDANSETRON HCL 4 MG/2ML IJ SOLN: 4.0000 mg | Freq: Four times a day (QID) | INTRAMUSCULAR | Status: DC | PRN

## 2022-06-30 MED ORDER — TERBUTALINE SULFATE 1 MG/ML IJ SOLN: 0.2500 mg | Freq: Once | INTRAMUSCULAR | Status: DC | PRN

## 2022-06-30 MED ORDER — MISOPROSTOL 25 MCG QUARTER TABLET
25.0000 ug | ORAL_TABLET | ORAL | Status: DC
  Administered 2022-06-30: 25 ug via ORAL

## 2022-06-30 MED ORDER — OXYTOCIN-SODIUM CHLORIDE 30-0.9 UT/500ML-% IV SOLN
2.5000 [IU]/h | INTRAVENOUS | Status: DC
  Administered 2022-07-01: 2.5 [IU]/h via INTRAVENOUS

## 2022-06-30 MED ORDER — OXYTOCIN-SODIUM CHLORIDE 30-0.9 UT/500ML-% IV SOLN
1.0000 m[IU]/min | INTRAVENOUS | Status: DC
  Administered 2022-07-01: 2 m[IU]/min via INTRAVENOUS

## 2022-06-30 MED ORDER — CALCIUM CARBONATE ANTACID 500 MG PO CHEW
CHEWABLE_TABLET | ORAL | Status: AC
  Filled 2022-06-30: qty 2

## 2022-06-30 NOTE — H&P (Signed)
OB History & Physical   History of Present Illness:  Chief Complaint: Category II tracing on office NST  HPI:  Natalie Scott is a 31 y.o. [id number] female at [redacted]w[redacted]d dated by LMP.  Her pregnancy has been complicated by late entry to care, history postpartum depression, anemia, marijuana use, GBS positive .    She reports occasional contractions.   She denies leakage of fluid.   She denies vaginal bleeding.   She reports fetal movement.    She was seen earlier today in the office for NST/ROB. 2 decelerations (unable to categorize due to difficulty tracing contractions) were noted on tracing and she was sent to L&D for further monitoring and possibly IOL. She had been scheduled for IOL on 7/9. She will stay for induction today based on non-reassuring NST in office, favorable cervix at term.  Total weight gain for pregnancy: 26.7 kg   Prenatal labs: Blood type: A+ Antibody screen negative Normal adult hgb HIV non-reactive RPR non-reactive Hep B Ab: Reactive/immune Hep C negative Rubella Immune Varicella Immune GC/CT negative/negative 1 hour gtt: 123 GBS positive on 06/03/22 TDAP last administered: 2017   Maternal Medical History:   Past Medical History:  Diagnosis Date   Anemia    Eczema    Headache    History of anemia    Hx: UTI (urinary tract infection)     History reviewed. No pertinent surgical history.  No Known Allergies  Prior to Admission medications   Medication Sig Start Date End Date Taking? Authorizing Provider  Iron-FA-B Cmp-C-Biot-Probiotic (FUSION PLUS) CAPS Take 1 tablet by mouth daily. 04/07/22  Yes Doreene Burke, CNM  Prenat-FeAsp-Meth-FA-DHA w/o A (PRENATE PIXIE) 10-0.6-0.4-200 MG CAPS Take 1 capsule by mouth daily. 10/21/21  Yes [provider]    OB History  Gravida Para Term Preterm AB Living  4 2 2  0 1 2  SAB IAB Ectopic Multiple Live Births  0 0 0 0 2    # Outcome Date GA Lbr Len/2nd Weight Sex Delivery Anes PTL Lv  4 Current            3 Term 01/09/11 [redacted]w[redacted]d  3629 g F Vag-Spont  N LIV  2 Term 01/09/11    F    LIV  1 AB             Prenatal care site: [facility]  Social History: She  reports that she has never smoked. She has never used smokeless tobacco. She reports that she does not currently use drugs after having used the following drugs: Marijuana. She reports that she does not drink alcohol.  Family History: family history includes Asthma in her brother and daughter; Breast cancer in her mother; Breast cancer (age of onset: 47) in her maternal aunt; Diabetes in an other family member; Lupus in her mother; Osteoarthritis in her paternal grandmother.    Review of Systems:  Review of Systems  Constitutional:  Negative for chills and fever.  HENT:  Negative for congestion, ear discharge, ear pain, hearing loss, sinus pain and sore throat.   Eyes:  Negative for blurred vision and double vision.  Respiratory:  Negative for cough, shortness of breath and wheezing.   Cardiovascular:  Negative for chest pain, palpitations and leg swelling.  Gastrointestinal:  Positive for abdominal pain. Negative for blood in stool, constipation, diarrhea, heartburn, melena, nausea and vomiting.  Genitourinary:  Negative for dysuria, flank pain, frequency, hematuria and urgency.  Musculoskeletal:  Negative for back pain, joint pain and myalgias.  Skin:  Negative for itching and rash.  Neurological:  Negative for dizziness, tingling, tremors, sensory change, speech change, focal weakness, seizures, loss of consciousness, weakness and headaches.  Endo/Heme/Allergies:  Negative for environmental allergies. Does not bruise/bleed easily.  Psychiatric/Behavioral:  Negative for depression, hallucinations, memory loss, substance abuse and suicidal ideas. The patient is not nervous/anxious and does not have insomnia.      Physical Exam:  BP 100/68   Pulse (!) 116   Temp 98.1 F (36.7 C) (Oral)   Resp 16   Ht 5\' 8"  (1.727 m)   Wt 87.9 kg    LMP 09/20/2021   BMI 29.46 kg/m   Constitutional: Well nourished, well developed female in no acute distress.  HEENT: normal Skin: Warm and dry.  Cardiovascular: Regular rate and rhythm.   Extremity:  no edema   Respiratory: Clear to auscultation bilateral. Normal respiratory effort Abdomen: FHT present Back: no CVAT Neuro: DTRs 2+, Cranial nerves grossly intact Psych: Alert and Oriented x3. No memory deficits. Normal mood and affect.    Pelvic exam: per CNM M. Swanson in office 3/60/-3   Baseline FHR: 150 beats/min   Variability: moderate   Accelerations: present   Decelerations: variable, early Contractions: present frequency: occasional Overall assessment: reassuring   No results found for: "SARSCOV2NAA"  Assessment:  Natalie Scott is a 31 y.o. G37P2012 female at [redacted]w[redacted]d with induction of labor for favorable cervix at term (decels noted on NST in office this morning)   Plan:  Admit to Labor & Delivery  CBC, T&S, Clrs, IVF (may have light meal prior to induction) GBS positive: penicillin prophylaxis  Fetal well-being: Category II and overall reassuring Start induction with pitocin    [redacted]w[redacted]d, CNM 06/30/2022 2:35 PM

## 2022-06-30 NOTE — OB Triage Note (Signed)
Pt sent from office for fetal monitoring

## 2022-06-30 NOTE — Progress Notes (Signed)
ROB at [redacted]w[redacted]d. Active baby. Went to L&D the other day for contractions, but the contraction spaced out. She has lost her mucus plug, but denies regular ctx, LOF, and vaginal bleeding. Ready for labor. Requests SVE/sweep today. 3/60/-3, swept. NST not reactive, 2 decels noted - unable to to categorize as late or early d/t not tracing ctx well. Reviewed strip with Dr. Logan Bores. Recommended to head to L&D for extended monitoring and/or IOL. Pt in agreement with plan. Report called to Tresea Mall, CNM.  Guadlupe Spanish, CNM

## 2022-06-30 NOTE — Progress Notes (Signed)
  Labor Progress Note   31 y.o. [id number] @ [redacted]w[redacted]d , admitted for  Pregnancy, Labor Management. IOL  Subjective:  Noticing some contractions throughout the day- nothing painful  Objective:  BP 106/71   Pulse (!) 112   Temp 98.1 F (36.7 C) (Oral)   Resp 16   Ht 5\' 8"  (1.727 m)   Wt 87.9 kg   LMP 09/20/2021   BMI 29.46 kg/m  Abd: gravid, ND, FHT present, mild tenderness on exam Extr: no edema SVE: CERVIX: 3.5 cm dilated, 50 effaced, -2 station Cervical sweep  EFM: FHR: 150 bpm, variability: moderate,  accelerations:  Present,  decelerations:  Variable Toco: Frequency: Every irregular  Labs: I have reviewed the patient's lab results.   Assessment & Plan:  09/22/2021 @ [redacted]w[redacted]d, admitted for  Pregnancy and Labor/Delivery Management  1. Pain management:  position changes . 2. FWB: FHT category II overall reassuring.  3. ID: GBS positive: penicillin prophylaxis 4. Labor management: s/p sweep, oral cytotec  All discussed with patient, see orders   [redacted]w[redacted]d, CNM [facility] 06/30/2022  8:12 PM

## 2022-07-01 ENCOUNTER — Encounter: Payer: Self-pay | Admitting: Obstetrics and Gynecology

## 2022-07-01 DIAGNOSIS — O99324 Drug use complicating childbirth: Secondary | ICD-10-CM

## 2022-07-01 DIAGNOSIS — F32A Depression, unspecified: Secondary | ICD-10-CM

## 2022-07-01 DIAGNOSIS — O0933 Supervision of pregnancy with insufficient antenatal care, third trimester: Secondary | ICD-10-CM

## 2022-07-01 DIAGNOSIS — O9902 Anemia complicating childbirth: Secondary | ICD-10-CM

## 2022-07-01 DIAGNOSIS — O09293 Supervision of pregnancy with other poor reproductive or obstetric history, third trimester: Secondary | ICD-10-CM

## 2022-07-01 DIAGNOSIS — O99344 Other mental disorders complicating childbirth: Secondary | ICD-10-CM

## 2022-07-01 DIAGNOSIS — O368330 Maternal care for abnormalities of the fetal heart rate or rhythm, third trimester, not applicable or unspecified: Secondary | ICD-10-CM

## 2022-07-01 DIAGNOSIS — F5089 Other specified eating disorder: Secondary | ICD-10-CM

## 2022-07-01 DIAGNOSIS — O480 Post-term pregnancy: Secondary | ICD-10-CM

## 2022-07-01 DIAGNOSIS — Z3A40 40 weeks gestation of pregnancy: Secondary | ICD-10-CM

## 2022-07-01 DIAGNOSIS — F1210 Cannabis abuse, uncomplicated: Secondary | ICD-10-CM

## 2022-07-01 DIAGNOSIS — O99820 Streptococcus B carrier state complicating pregnancy: Secondary | ICD-10-CM

## 2022-07-01 DIAGNOSIS — D649 Anemia, unspecified: Secondary | ICD-10-CM

## 2022-07-01 LAB — URINE DRUG SCREEN, QUALITATIVE (ARMC ONLY)
Amphetamines, Ur Screen: NOT DETECTED
Barbiturates, Ur Screen: NOT DETECTED
Benzodiazepine, Ur Scrn: NOT DETECTED
Cannabinoid 50 Ng, Ur ~~LOC~~: NOT DETECTED
Cocaine Metabolite,Ur ~~LOC~~: NOT DETECTED
MDMA (Ecstasy)Ur Screen: NOT DETECTED
Methadone Scn, Ur: NOT DETECTED
Opiate, Ur Screen: NOT DETECTED
Phencyclidine (PCP) Ur S: NOT DETECTED
Tricyclic, Ur Screen: NOT DETECTED

## 2022-07-01 LAB — RPR: RPR Ser Ql: NONREACTIVE

## 2022-07-01 MED ORDER — SIMETHICONE 80 MG PO CHEW: 80.0000 mg | CHEWABLE_TABLET | ORAL | Status: DC | PRN

## 2022-07-01 MED ORDER — ONDANSETRON HCL 4 MG PO TABS: 4.0000 mg | ORAL_TABLET | ORAL | Status: DC | PRN

## 2022-07-01 MED ORDER — ONDANSETRON HCL 4 MG/2ML IJ SOLN: 4.0000 mg | INTRAMUSCULAR | Status: DC | PRN

## 2022-07-01 MED ORDER — COCONUT OIL OIL: 1.0000 | TOPICAL_OIL | Status: DC | PRN

## 2022-07-01 MED ORDER — ACETAMINOPHEN 325 MG PO TABS
650.0000 mg | ORAL_TABLET | ORAL | Status: DC | PRN
  Administered 2022-07-01 (×3): 650 mg via ORAL
  Filled 2022-07-01: qty 2

## 2022-07-01 MED ORDER — IBUPROFEN 600 MG PO TABS
600.0000 mg | ORAL_TABLET | Freq: Four times a day (QID) | ORAL | Status: DC
  Administered 2022-07-01 – 2022-07-02 (×4): 600 mg via ORAL
  Filled 2022-07-01 (×4): qty 1

## 2022-07-01 MED ORDER — PRENATAL MULTIVITAMIN CH
1.0000 | ORAL_TABLET | Freq: Every day | ORAL | Status: DC
  Administered 2022-07-01 – 2022-07-02 (×2): 1 via ORAL
  Filled 2022-07-01 (×2): qty 1

## 2022-07-01 MED ORDER — BENZOCAINE-MENTHOL 20-0.5 % EX AERO
INHALATION_SPRAY | CUTANEOUS | Status: AC
  Administered 2022-07-01: 1 via TOPICAL
  Filled 2022-07-01: qty 56

## 2022-07-01 MED ORDER — OXYCODONE HCL 5 MG PO TABS: 10.0000 mg | ORAL_TABLET | ORAL | Status: DC | PRN

## 2022-07-01 MED ORDER — FUSION PLUS PO CAPS
1.0000 | ORAL_CAPSULE | Freq: Every day | ORAL | Status: DC
Start: 2022-07-01 — End: 2022-07-01

## 2022-07-01 MED ORDER — BENZOCAINE-MENTHOL 20-0.5 % EX AERO: 1.0000 | INHALATION_SPRAY | CUTANEOUS | Status: DC | PRN

## 2022-07-01 MED ORDER — ZOLPIDEM TARTRATE 5 MG PO TABS: 5.0000 mg | ORAL_TABLET | Freq: Every evening | ORAL | Status: DC | PRN

## 2022-07-01 MED ORDER — DIPHENHYDRAMINE HCL 25 MG PO CAPS: 25.0000 mg | ORAL_CAPSULE | Freq: Four times a day (QID) | ORAL | Status: DC | PRN

## 2022-07-01 MED ORDER — DIBUCAINE (PERIANAL) 1 % EX OINT: 1.0000 | TOPICAL_OINTMENT | CUTANEOUS | Status: DC | PRN

## 2022-07-01 MED ORDER — OXYCODONE HCL 5 MG PO TABS: 5.0000 mg | ORAL_TABLET | ORAL | Status: DC | PRN

## 2022-07-01 MED ORDER — WITCH HAZEL-GLYCERIN EX PADS: 1.0000 | MEDICATED_PAD | CUTANEOUS | Status: DC | PRN

## 2022-07-01 MED ORDER — IBUPROFEN 600 MG PO TABS
ORAL_TABLET | ORAL | Status: AC
  Administered 2022-07-01: 600 mg via ORAL
  Filled 2022-07-01: qty 1

## 2022-07-01 MED ORDER — TETANUS-DIPHTH-ACELL PERTUSSIS 5-2.5-18.5 LF-MCG/0.5 IM SUSY: 0.5000 mL | PREFILLED_SYRINGE | Freq: Once | INTRAMUSCULAR | Status: DC

## 2022-07-01 MED ORDER — DOCUSATE SODIUM 100 MG PO CAPS
100.0000 mg | ORAL_CAPSULE | Freq: Two times a day (BID) | ORAL | Status: DC
  Administered 2022-07-02: 100 mg via ORAL
  Filled 2022-07-01: qty 1

## 2022-07-01 MED ORDER — FENTANYL CITRATE (PF) 100 MCG/2ML IJ SOLN: 50.0000 ug | INTRAMUSCULAR | Status: DC | PRN

## 2022-07-01 NOTE — Discharge Summary (Signed)
Postpartum Discharge Summary  Date of Service updated 07/01/2022     Patient Name: Natalie Scott DOB: [birth year] MRN: [id number]  Date of admission: 06/30/2022 Delivery date:07/01/2022  Delivering provider: Imagene Riches  Date of discharge: 07/02/2022  Admitting diagnosis: Non-reactive NST (non-stress test) [O28.8] Labor and delivery, indication for care [O75.9] Intrauterine pregnancy: [redacted]w[redacted]d    Secondary diagnosis:  Active Problems:   Labor and delivery, indication for care   Postpartum care following vaginal delivery   Anemia in pregnancy, delivered, current hospitalization   Pica in adults  Additional problems: none    Discharge diagnosis: Term Pregnancy Delivered                                              Post partum procedures: none Augmentation: AROM, pitocin Complications: None  Hospital course: Induction of Labor With Vaginal Delivery   31y.o. yo [id number] admitted to the hospital 06/30/2022 for induction of labor.  Indication for induction: Postdates and Favorable cervix at term.  Patient had an uncomplicated labor course as follows: Membrane Rupture Time/Date: 9:07 AM ,07/01/2022   Delivery Method:Vaginal, Spontaneous  Episiotomy: None  Lacerations:  None  Details of delivery can be found in separate delivery note.  Patient had a routine postpartum course. Patient is discharged home 07/02/22.  Newborn Data: Birth date:07/01/2022  Birth time:9:54 AM  Gender:Female  Living status:Living  Apgars:8 ,9  Weight:3270 g 7 lbs., 3 oz.  Magnesium Sulfate received: No BMZ received: No Rhophylac:N/A MMR:No T-DaP: declined antenatally Flu: No Transfusion:No  Physical exam  Vitals:   07/01/22 1550 07/01/22 1928 07/01/22 2323 07/02/22 0738  BP: 106/75 114/75 115/74 113/80  Pulse: (!) 101 93 84 80  Resp: '20 18 18 18  ' Temp: 97.6 F (36.4 C) 98.7 F (37.1 C) 98.9 F (37.2 C) 98.3 F (36.8 C)  TempSrc: Oral Oral Oral Oral  SpO2: 100% 100% 99% 98%   Weight:      Height:       General: alert, cooperative, and no distress Lochia: appropriate Uterine Fundus: firm Incision: N/A DVT Evaluation: No evidence of DVT seen on physical exam. Labs: Lab Results  Component Value Date   WBC 9.1 07/02/2022   HGB 9.3 (L) 07/02/2022   HCT 29.7 (L) 07/02/2022   MCV 81.8 07/02/2022   PLT 251 07/02/2022      Latest Ref Rng & Units 04/21/2022    2:35 PM  CMP  Glucose 70 - 99 mg/dL 96   BUN 6 - 20 mg/dL 8   Creatinine 0.57 - 1.00 mg/dL 0.58   Sodium 134 - 144 mmol/L 140   Potassium 3.5 - 5.2 mmol/L 4.2   Chloride 96 - 106 mmol/L 107   CO2 20 - 29 mmol/L 18   Calcium 8.7 - 10.2 mg/dL 9.1   Total Protein 6.0 - 8.5 g/dL 6.0   Total Bilirubin 0.0 - 1.2 mg/dL 0.3   Alkaline Phos 44 - 121 IU/L 62   AST 0 - 40 IU/L 16   ALT 0 - 32 IU/L 12    Edinburgh Score:    07/01/2022    9:35 PM  Edinburgh Postnatal Depression Scale Screening Tool  I have been able to laugh and see the funny side of things. 0  I have looked forward with enjoyment to things. 0  I have blamed  myself unnecessarily when things went wrong. 1  I have been anxious or worried for no good reason. 0  I have felt scared or panicky for no good reason. 0  Things have been getting on top of me. 0  I have been so unhappy that I have had difficulty sleeping. 0  I have felt sad or miserable. 0  I have been so unhappy that I have been crying. 0  The thought of harming myself has occurred to me. 0  Edinburgh Postnatal Depression Scale Total 1      After visit meds:  Allergies as of 07/02/2022   No Known Allergies      Medication List     TAKE these medications    Fusion Plus Caps Take 1 tablet by mouth daily.   Prenate Pixie 10-0.6-0.4-200 MG Caps Take 1 capsule by mouth daily.         Discharge home in stable condition Infant Feeding: Breast Infant Disposition:home with mother Discharge instruction: per After Visit Summary and Postpartum booklet. Activity:  Advance as tolerated. Pelvic rest for 6 weeks.  Diet: routine diet Anticipated Birth Control: IUD Postpartum Appointment:2 weeksfor virtual visit, and again at 6 weeks PP Additional Postpartum F/U: Postpartum Depression checkup Future Appointments:No future appointments. Follow up Visit:  Follow-up Information     [facility] [place]. Schedule an appointment as soon as possible for a visit in 2 week(s).   Why: Please call the [facility] office and set up a phone visit for 2 weeks post delivery. also make an appointment for a 6 week post delivery physical and birth control.                Lloyd Huger, CNM 9:55 AM 07/02/22

## 2022-07-01 NOTE — Lactation Note (Signed)
This note was copied from a baby's chart. Lactation Consultation Note  Patient Name: Natalie Scott [id number] Date: 07/01/2022 Reason for consult: Initial assessment;Term Age:33 hours  *LC hand expressed 57mL of colostrum into container (placed with lid on counter) to be used later if needed. Baby attempted to feed but was sleepy. Reiterated attempts every few hours and hand expression if baby is sleepy.   [name] 07/01/2022, 4:28 PM

## 2022-07-01 NOTE — Progress Notes (Signed)
Natalie Scott is a 31 y.o. E1E0712 at [redacted]w[redacted]d by ultrasound admitted for active labor  Subjective:Now very uncomfortable. Has asked for IV meds, but shortly therafter c/o rectal pressure.   Objective: BP 123/82 (BP Location: Right Arm)   Pulse 94   Temp 98.4 F (36.9 C) (Oral)   Resp 18   Ht 5\' 8"  (1.727 m)   Wt 87.9 kg   LMP 09/20/2021   BMI 29.46 kg/m  No intake/output data recorded. No intake/output data recorded.  FHT:  FHR: 145 bpm, variability: moderate,  accelerations:  Present,  decelerations:  Present some difficulty tracing ucs due to patient's constant movement; some variables noted, but not repetitive UC:   regular, every 2-3 minutes SVE:   Dilation: 5.5 Effacement (%): 90 Station: -2 Exam by:: [email or web address] CNM AROM performed- ++ bloody show and clear fluid.  Labs: Lab Results  Component Value Date   WBC 6.0 06/30/2022   HGB 9.5 (L) 06/30/2022   HCT 29.7 (L) 06/30/2022   MCV 81.1 06/30/2022   PLT 236 06/30/2022    Assessment / Plan: Augmentation of labor, progressing well  Labor: Progressing normally Fetal Wellbeing:  Category II Pain Control:  Labor support without medications I/D:  n/a Anticipated MOD:  NSVD Delivery table set up and in room.  08/31/2022, CNM 07/01/2022, 9:16 AM

## 2022-07-01 NOTE — Lactation Note (Signed)
This note was copied from a baby's chart. Lactation Consultation Note  Patient Name: Natalie Scott [id number] Date: 07/01/2022 Reason for consult: Initial assessment;Term Age:33 hours  Maternal Data Has patient been taught Hand Expression?: Yes Does the patient have breastfeeding experience prior to this delivery?: Yes How long did the patient breastfeed?: 3 months and 7 months  Mom is P3, 74 and 45yr old at home, breastfeeding experience with both of them. When hand expressing mom has copious amounts of colostrum that is easily expressed.  Feeding Mother's Current Feeding Choice: Breast Milk  Baby fed well following delivery, attempting to feed now for time since last feeding.  LATCH Score Latch: Repeated attempts needed to sustain latch, nipple held in mouth throughout feeding, stimulation needed to elicit sucking reflex. (baby not eagerly sustaining latch)  Audible Swallowing: None  Type of Nipple: Everted at rest and after stimulation  Comfort (Breast/Nipple): Soft / non-tender  Hold (Positioning): No assistance needed to correctly position infant at breast.  LATCH Score: 7  Baby awake, somewhat fussy- attempts made to latch, baby can open wide and accept nipple but does not stay at the breast. LC hand expressed lots of colostrum easily which baby licked/swallowed but still did not show signs of wanting to feed.  Lactation Tools Discussed/Used    Interventions Interventions: Breast feeding basics reviewed;Assisted with latch;Hand express;Adjust position;Support pillows;Position options;Education  Reviewed with mom normal newborn feeding patterns and behaviors, small stomach size, colostrum easily expressed- reassurance that baby may be full and not overly hungry at this time. Encouraged hand expression/spoon feeding as alternative if baby is sleepy. Encouraged also to spend time skin to skin with baby.  Discharge    Consult Status Consult Status: Follow-up Date:  07/01/22 Follow-up type: Call as needed    [name] 07/01/2022, 4:05 PM

## 2022-07-01 NOTE — Lactation Note (Signed)
This note was copied from a baby's chart. Lactation Consultation Note  Patient Name: Natalie Scott Date: 07/01/2022   Age:34 hours  Attempted lactation check-in; mom sleeping soundly.  Natalie Scott 07/01/2022, 2:08 PM

## 2022-07-02 ENCOUNTER — Ambulatory Visit: Payer: Self-pay

## 2022-07-02 LAB — CBC
HCT: 29.7 % — ABNORMAL LOW (ref 36.0–46.0)
Hemoglobin: 9.3 g/dL — ABNORMAL LOW (ref 12.0–15.0)
MCH: 25.6 pg — ABNORMAL LOW (ref 26.0–34.0)
MCHC: 31.3 g/dL (ref 30.0–36.0)
MCV: 81.8 fL (ref 80.0–100.0)
Platelets: 251 10*3/uL (ref 150–400)
RBC: 3.63 MIL/uL — ABNORMAL LOW (ref 3.87–5.11)
RDW: 15.9 % — ABNORMAL HIGH (ref 11.5–15.5)
WBC: 9.1 10*3/uL (ref 4.0–10.5)
nRBC: 0 % (ref 0.0–0.2)

## 2022-07-02 NOTE — Final Progress Note (Signed)
Post Partum Day 1 Subjective: Natalie Scott is feeling well and would like to go home today. She is ambulating, voiding, and tolerating POs without difficulty. Her pain is well-controlled and her bleeding is minimal. Breastfeeding is going well. Her mood is stable. She is proud of her unmedicated birth!  Objective: Blood pressure 113/80, pulse 80, temperature 98.3 F (36.8 C), temperature source Oral, resp. rate 18, height 5\' 8"  (1.727 m), weight 87.9 kg, last menstrual period 09/20/2021, SpO2 98 %, unknown if currently breastfeeding.  Physical Exam:  General: cooperative and appears stated age Lochia: appropriate Uterine Fundus: firm Incision: N/A DVT Evaluation: No evidence of DVT seen on physical exam.  Recent Labs    06/30/22 1415 07/02/22 0821  HGB 9.5* 9.3*  HCT 29.7* 29.7*    Assessment/Plan: Discharge home Breastfeeding Continue iron supplement Video visit in 2 weeks. PP OV in 6 weeks. Plans IUD for contraception Discharge teaching completed.   LOS: 2 days   09/02/22, CNM 07/02/2022, 9:57 AM

## 2022-07-02 NOTE — Progress Notes (Signed)
Mother discharged. Discharge instructions given. Mother verbalizes understanding. Transported by axillary.  

## 2022-07-02 NOTE — Lactation Note (Signed)
This note was copied from a baby's chart. Lactation Consultation Note  Patient Name: Natalie Scott [id number] Date: 07/02/2022 Reason for consult: Follow-up assessment Age:33 hours  Maternal Data See initial consult note 07/01/22.Today mom reports baby latches and at times does not actively suckle and feed at the breast but will readily take the expressed colostrum via the bottle. Mom with questions how to encourage the baby to breastfeed. She has not experienced this with her other children.  Has patient been taught Hand Expression?: Yes Does the patient have breastfeeding experience prior to this delivery?: Yes How long did the patient breastfeed?: 3 and 7 months  Feeding Mother's Current Feeding Choice: Breast Milk Assisted mom with tips to encourage baby to actively feed after latching including breast massage to encourage flow of colostrum. Baby did respond and actively feed with swallows as mom messages her breast with baby latched on. Also, recommended mom provide some tactile stimulation when baby appears to get sleepy at the breast. Discussed with mom how to pace baby when bottle feeding.  LATCH Score Latch: Repeated attempts needed to sustain latch, nipple held in mouth throughout feeding, stimulation needed to elicit sucking reflex.  Audible Swallowing: A few with stimulation  Type of Nipple: Everted at rest and after stimulation  Comfort (Breast/Nipple): Soft / non-tender  Hold (Positioning): No assistance needed to correctly position infant at breast.  LATCH Score: 8   Interventions Interventions: Breast feeding basics reviewed;Assisted with latch;Breast massage;Breast compression;Hand pump;Education  Discharge Discharge Education: Engorgement and breast care;Warning signs for feeding baby;Other (comment) (Provided mom with outpatient LC info for [facility].) Pump: Manual (reviewed use of manual Harmony pump for home use. Per mom she may get an electric breastpump if  needed.)  Consult Status Consult Status: Complete Date: 07/02/22  Update provided to care nurse.  Fuller Song 07/02/2022, 1:37 PM

## 2022-07-08 ENCOUNTER — Telehealth: Payer: Self-pay

## 2022-07-08 NOTE — Telephone Encounter (Signed)
Lactation Note:  Called patient to schedule outpatient lactation appointment. Per patient she is going to "just pump and bottle feed" and declines coming today for outpatient appointment. This is an experienced breastfeeding mother who breastfed her previous children. She began breast pumping and hand expression in the immediate post partum period ,while in the hospital, because baby would not actively breastfeed. Per patient she is pumping "so much milk" her freezer is full. Patient reports she is producing 12 ounces each time she pumps( uses electric pump 7-8 times per day)and because she is uncomfortable she intermittently hand expresses and can easily obtain 4 ounces.   Discussed generally it is not recommended to down regulate milk production during the first 6 weeks post partum. Her report of the volume she is producing per pump session, the level of her discomfort and fatigue to manage this process we did discuss a gradual approach to try to leave a small amount of of milk in the breast every other pump session. Discussed by not pumping to completely empty for half of the pump sessions she may see a decrease within 72 hours of doing so. Recommended she keep a record of milk expressions to keep track of her production as there is a risk of decreasing the supply too much during this sensitive time period. Additional comfort measures, such as ice packs post pumping also discussed. Patient verbalized understanding of information provided.Patient agreed to a follow up call from [facility].  Support and encouragement provided.  Lonzo Cloud RN,BSN,IBCLC

## 2022-07-15 ENCOUNTER — Telehealth (INDEPENDENT_AMBULATORY_CARE_PROVIDER_SITE_OTHER): Payer: Medicaid Other | Admitting: Obstetrics

## 2022-07-16 NOTE — Progress Notes (Signed)
Chaniece logged on to the appointment but had logged off by the time I  joined. CMA attempted to call x 3 but was unable to connect. Will reschedule visit.

## 2022-07-30 ENCOUNTER — Telehealth: Payer: Self-pay | Admitting: Obstetrics

## 2022-07-30 NOTE — Telephone Encounter (Signed)
Patient called and states that she think she is having some depression. Patient states she is feeling sad and feels overwhelmed. Patient missed her 2wk PPV and is scheduled for a 6wk visit on 08/16/22. Pt is asking if she could receive a call or have a prescription sent in to her pharmacy to help her with post partum depression. Please advise.

## 2022-08-09 ENCOUNTER — Encounter: Payer: Self-pay | Admitting: Certified Nurse Midwife

## 2022-08-10 ENCOUNTER — Encounter: Payer: Self-pay | Admitting: Certified Nurse Midwife

## 2022-08-10 ENCOUNTER — Telehealth (INDEPENDENT_AMBULATORY_CARE_PROVIDER_SITE_OTHER): Payer: Medicaid Other | Admitting: Certified Nurse Midwife

## 2022-08-10 DIAGNOSIS — Z1389 Encounter for screening for other disorder: Secondary | ICD-10-CM

## 2022-08-10 DIAGNOSIS — F53 Postpartum depression: Secondary | ICD-10-CM | POA: Diagnosis not present

## 2022-08-10 NOTE — Progress Notes (Signed)
EPDS - 22

## 2022-08-10 NOTE — Progress Notes (Signed)
Virtual Visit via Video Note  I connected with Natalie Scott on 08/10/22 at  8:00 AM EDT by a video enabled telemedicine application and verified that I am speaking with the correct person using two identifiers.  Location: Patient: in her care Provider: at the office   I discussed the limitations of evaluation and management by telemedicine and the availability of in person appointments. The patient expressed understanding and agreed to proceed.  History of Present Illness: 31 year old status post SVD 07/01/22 with concern for postpartum depression    Observations/Objective: Pt states she is crying all the time, is having difficulty sleeping, and is stressed with breastfeeding. She has support from her mother. The FOB does not live with her and comes to visit the baby.   Assessment and Plan: EDPS 22, pt declines medication at this time state she has done Zoloft in the past and did not like how it made her feel. She prefers to try counseling. Orders placed for urgent referral. She is requesting to delay return to work due to her current mental status.   Follow Up Instructions: As scheduled for in office visit on 8/21.    I discussed the assessment and treatment plan with the patient. The patient was provided an opportunity to ask questions and all were answered. The patient agreed with the plan and demonstrated an understanding of the instructions.   The patient was advised to call back or seek an in-person evaluation if the symptoms worsen or if the condition fails to improve as anticipated.  I provided 15 minutes of non-face-to-face time during this encounter.   Doreene Burke, CNM

## 2022-08-12 ENCOUNTER — Ambulatory Visit (INDEPENDENT_AMBULATORY_CARE_PROVIDER_SITE_OTHER): Payer: Medicaid Other | Admitting: Psychiatry

## 2022-08-12 ENCOUNTER — Other Ambulatory Visit
Admission: RE | Admit: 2022-08-12 | Discharge: 2022-08-12 | Disposition: A | Discharge status: Home / Self Care | Payer: Medicaid Other | Source: Ambulatory Visit | Attending: Psychiatry | Admitting: Psychiatry

## 2022-08-12 ENCOUNTER — Encounter: Payer: Self-pay | Admitting: Psychiatry

## 2022-08-12 VITALS — BP 122/79 | HR 75 | Temp 97.9°F | Wt 161.8 lb

## 2022-08-12 DIAGNOSIS — F321 Major depressive disorder, single episode, moderate: Secondary | ICD-10-CM | POA: Diagnosis present

## 2022-08-12 LAB — TSH: TSH: 1.454 u[IU]/mL (ref 0.350–4.500)

## 2022-08-12 MED ORDER — ESCITALOPRAM OXALATE 10 MG PO TABS: ORAL_TABLET | ORAL | 1 refills | Status: DC
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
## 2022-08-16 ENCOUNTER — Encounter: Payer: Self-pay | Admitting: Obstetrics

## 2022-08-16 ENCOUNTER — Ambulatory Visit (INDEPENDENT_AMBULATORY_CARE_PROVIDER_SITE_OTHER): Payer: Medicaid Other | Admitting: Obstetrics

## 2022-08-16 DIAGNOSIS — F53 Postpartum depression: Secondary | ICD-10-CM

## 2022-08-16 MED ORDER — NORETHINDRONE 0.35 MG PO TABS: 1.0000 | ORAL_TABLET | Freq: Every day | ORAL | 3 refills | Status: DC

## 2022-08-16 NOTE — Progress Notes (Signed)
SUBJECTIVE  Natalie Scott is a 31 y.o. [phone] who gave birth to a fe/female infant at [redacted]w[redacted]d weeks via NSVD over an intact perineum on 07/21/22 without anesthesia.  She is breast feeding. She is here today for a PP follow-up visit.  Her primary concern today is PPD and feelings of anxiety and overwhelm. She saw a psychiatrist on Friday who prescribed Lexapro, but she is reluctant to start this medication because of concerns that it might make her feel worse or cause harm to the baby. She has very little help at home and has trouble sleeping after the baby wakes up at night. She left the baby with her mother once when she had a class to attend, and she felt anxious the whole time. She has stopped using marijuana but is drinking in the evenings for stress relief, which she does not want to continue.   ROS  Pertinent items are noted in HPI.  Mood: overwhelmed, depressed, anxious. Denies SI/HI. EPDS 23. Bowel function: normal Bladder function: normal Vaginal bleeding: just stopped Pain: denies Has not yet resumed intercourse  Denies difficulty breathing, chest pain, lower extremity pain or swelling, excessive vaginal bleeding, vaginal pain.   Infant: Doing well. Exclusively breastfeeding.  OBJECTIVE Last Weight  Most recent update: 08/16/2022 11:15 AM    Weight  74.4 kg (164 lb)            Body mass index is 26.47 kg/m.  BP 129/89   Pulse 85   Ht 5\' 6"  (1.676 m)   Wt 164 lb (74.4 kg)   LMP  (LMP Unknown)   Breastfeeding Yes   BMI 26.47 kg/m  Patient has no known allergies.  Past Medical/Surgical History Past Medical History:  Diagnosis Date   Anemia    Eczema    Headache    History of anemia    Hx: UTI (urinary tract infection)    History reviewed. No pertinent surgical history.  Last Pap:  2020. LSIL +HPV. Unsure if colpo was done  BP 129/89   Pulse 85   Ht 5\' 6"  (1.676 m)   Wt 164 lb (74.4 kg)   LMP  (LMP Unknown)   Breastfeeding Yes   BMI 26.47 kg/m  General  appearance: alert, cooperative, appears stated age, and fatigued Head: Normocephalic, without obvious abnormality, atraumatic Eyes: negative findings: lids and lashes normal and conjunctivae and sclerae normal Neck: no adenopathy, supple, symmetrical, trachea midline, and thyroid not enlarged, symmetric, no tenderness/mass/nodules Lungs: clear to auscultation bilaterally Heart: regular rate and rhythm, S1, S2 normal, no murmur, click, rub or gallop Abdomen: soft, non-tender; bowel sounds normal; no masses,  no organomegaly and fundus not palpable Extremities: extremities normal, atraumatic, no cyanosis or edema Pulses: 2+ and symmetric Skin: Skin color, texture, turgor normal. No rashes or lesions Lymph nodes: Cervical, supraclavicular, and axillary nodes normal.  ASSESSMENT 6 weeks PP Normal healing Significant PPD Desires contraception  PLAN Reviewed resuming normal activities Long discussion regarding depression, anxiety, and feelings of being overwhelmed and blaming herself for everything. Strongly encouraged to start Lexapro. Reviewed risks to baby of small amount of medication in breast milk versus untreated depression. Encouraged to call [facility] for counselor as advised by psychiatrist. Discussed sleep hygiene and prioritizing sleep and daily self-care. Discussed alternatives to alcohol for relaxation. Encouraged to identify potential sources of support. Instructed to go to ED if she develops SI/HI.  Follow up mood check in 2 weeks or PRN.   2021, CNM

## 2022-09-06 NOTE — Telephone Encounter (Signed)
Patient has came by the office to follow up on FMLA denial. Patient states she was denied because of not accepting medication, patient reports she is taking escitalopram (LEXAPRO) 10 MG tablet. Could you please call patient to follow up on what she needs to do. Patient reports she isn't get paid and needs our help. Please advise?

## 2022-09-09 ENCOUNTER — Telehealth: Payer: Self-pay

## 2022-09-09 ENCOUNTER — Telehealth (INDEPENDENT_AMBULATORY_CARE_PROVIDER_SITE_OTHER): Payer: Medicaid Other | Admitting: Obstetrics

## 2022-09-09 DIAGNOSIS — F53 Postpartum depression: Secondary | ICD-10-CM

## 2022-09-09 NOTE — Telephone Encounter (Signed)
Previously faxed copy has been confirmed received by [facility] company; Hard copy has been left at front for patient pickup. I have also sent over FMLA paperwork again to fax number provided.

## 2022-09-09 NOTE — Telephone Encounter (Signed)
This pt needs her fmla papers she stated that her job hasnt gotten them as of yet

## 2022-09-09 NOTE — Progress Notes (Signed)
Virtual Visit via Video Note  I connected with Natalie Scott on 09/09/22 at  4:00 PM EDT by a video enabled telemedicine application and verified that I am speaking with the correct person using two identifiers.  Location: Patient: Set designer Provider: Office   I discussed the limitations of evaluation and management by telemedicine and the availability of in person appointments. The patient expressed understanding and agreed to proceed.  History of Present Illness: Natalie Scott presents today for follow up on postpartum depression. She gave birth 07/01/22 and has been struggling with her mood. She started on Lexapro about 2 weeks ago. She feels her mood is about the same. She is experiencing some headache from the medication. She has stopped drinking and is trying to use some positive coping strategies, such as taking a quiet break when needed and having a family dinner time. She is feeling a lot of overwhelm, overstimulation, and feelings of not being good enough. She has very limited support from the FOB. Her mother providers her with some support. Her sleep is poor but she is working on good sleep habits such as putting away her phone at night and listening to relaxing music. She has been in contact with a psychiatrist, but her appointment is not until mid-November. She reached out to a therapist but was not able to to make an appointment without a referral.   Observations/Objective: Did not fill out EPDS today Natalie Scott is actively trying to seek assistance and is having difficulty accessing resources   Assessment and Plan: 1) Discussed coping strategies, parenting support, and expectations of the  medication. Reinforced that she is doing a lot of positive things for herself and her children. Encouraged to look for sources of support in family. Referral sent to [facility] to [facility], and they stated they will send resources tomorrow. Will follow up with November when I receive this information.  Follow  Up Instructions:    I discussed the assessment and treatment plan with the patient. The patient was provided an opportunity to ask questions and all were answered. The patient agreed with the plan and demonstrated an understanding of the instructions.   The patient was advised to call back or seek an in-person evaluation if the symptoms worsen or if the condition fails to improve as anticipated.  I provided 14 minutes of non-face-to-face time during this encounter.   Natalie Scott, CNM

## 2022-09-16 ENCOUNTER — Encounter: Payer: Self-pay | Admitting: Obstetrics
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
## 2022-09-20 ENCOUNTER — Encounter: Payer: Self-pay | Admitting: Psychiatry

## 2022-09-20 ENCOUNTER — Ambulatory Visit (INDEPENDENT_AMBULATORY_CARE_PROVIDER_SITE_OTHER): Payer: Medicaid Other | Admitting: Psychiatry

## 2022-09-20 VITALS — BP 99/64 | HR 103 | Temp 98.3°F | Wt 166.4 lb

## 2022-09-20 DIAGNOSIS — F321 Major depressive disorder, single episode, moderate: Secondary | ICD-10-CM | POA: Diagnosis not present

## 2022-09-20 MED ORDER — ESCITALOPRAM OXALATE 20 MG PO TABS: 20.0000 mg | ORAL_TABLET | Freq: Every day | ORAL | 1 refills | Status: DC

## 2022-10-05 ENCOUNTER — Ambulatory Visit: Payer: Self-pay | Admitting: Psychiatry

## 2022-10-07 ENCOUNTER — Encounter: Payer: Self-pay | Admitting: Obstetrics
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
## 2022-11-01 ENCOUNTER — Ambulatory Visit: Payer: Medicaid Other | Admitting: Psychiatry

## 2022-11-08 ENCOUNTER — Ambulatory Visit: Payer: Medicaid Other | Admitting: Psychiatry

## 2023-03-02 ENCOUNTER — Ambulatory Visit: Payer: Self-pay

## 2023-03-02 NOTE — Lactation Note (Signed)
This note was copied from a baby's chart. Lactation Consultation Note  Patient Name: Natalie Scott [id number] Date: 03/02/2023 Age:34 m.o. Reason for consult: Follow-up assessment;MD order  P36, 35 month old admitted to Pediatric Unit for failure to thrive and febrile.  Mother states she is pumping 3 times per day and breastfeeding on one breast per session 3 times per day for a total of 6 feedings per day. Noted significant thrush in infant's mouth and tongue. Mother complains of pain radiating into breast which is indicative of thrush.  MD aware and will prescribe medication for both mother and baby.  Discussed with mother the importance of sanitizing pump parts at each session to kill yeast and infant's bottles and pacifiers.  Set up DEBP with 27 mm flanges which were appropriate size for mother and comfortable.  Completed weighted feed. Initial Weight          5520 g Post Feed Weight   5592 g (After first breast) Transfer                        72 g Post Feed Weight    5602 g (After second breast) Total Feeding Transfer 82 ml.  Breastfeeding Plan: Mother will breastfeed on demand on both breasts. Mother will post pump q 3 hours and give volume back to baby after feedings. MD and nutritionist will discuss additional nutrition.    Maternal Data Has patient been taught Hand Expression?: Yes Does the patient have breastfeeding experience prior to this delivery?: Yes  Feeding Mother's Current Feeding Choice: Breast Milk  LATCH Score Latch: Grasps breast easily, tongue down, lips flanged, rhythmical sucking.  Audible Swallowing: A few with stimulation  Type of Nipple: Everted at rest and after stimulation  Comfort (Breast/Nipple): Filling, red/small blisters or bruises, mild/mod discomfort  Hold (Positioning): Assistance needed to correctly position infant at breast and maintain latch.  LATCH Score: 7   Lactation Tools Discussed/Used Tools: Pump;Flanges Flange Size: 27 Breast  pump type: Double-Electric Breast Pump;Manual Pump Education: Setup, frequency, and cleaning;Milk Storage Reason for Pumping: stimulation and supplementation Pumping frequency:  (q 3 hours)  Interventions Interventions: DEBP;Education  Discharge Pump: Personal;DEBP (Evenflow)  Consult Status Consult Status: Complete Date: 03/02/23    Natalie Scott 03/02/2023, 3:16 PM

## 2023-04-21 ENCOUNTER — Ambulatory Visit (INDEPENDENT_AMBULATORY_CARE_PROVIDER_SITE_OTHER): Payer: Medicaid Other

## 2023-04-21 ENCOUNTER — Other Ambulatory Visit (HOSPITAL_COMMUNITY)
Admission: RE | Admit: 2023-04-21 | Discharge: 2023-04-21 | Disposition: A | Discharge status: Home / Self Care | Payer: Medicaid Other | Source: Ambulatory Visit | Attending: Obstetrics | Admitting: Obstetrics

## 2023-04-21 VITALS — BP 110/60 | Ht 66.0 in | Wt 185.0 lb

## 2023-04-21 DIAGNOSIS — Z113 Encounter for screening for infections with a predominantly sexual mode of transmission: Secondary | ICD-10-CM | POA: Diagnosis not present

## 2023-04-21 DIAGNOSIS — N898 Other specified noninflammatory disorders of vagina: Secondary | ICD-10-CM | POA: Diagnosis present

## 2023-04-21 NOTE — Patient Instructions (Signed)
Chlamydia, Female  Chlamydia is a sexually transmitted infection (STI). This infection spreads through sexual contact. Chlamydia can occur in different areas of the body, including: The urethra. This is the part of the body that drains urine from the bladder. The cervix. This is the lowest part of the uterus. The throat. The rectum. This condition is not difficult to treat. However, if left untreated, chlamydia can lead to more serious health problems, including pelvic inflammatory disease (PID). PID can increase your risk of being unable to have children. In pregnant women, untreated chlamydia can cause serious complications during pregnancy or health problems for the baby after delivery. What are the causes? This condition is caused by a bacteria called Chlamydia trachomatis. The bacteria are spread from an infected partner during sexual activity. Chlamydia can spread through contact with the genitals, mouth, or rectum. What increases the risk? The following factors may make you more likely to develop this condition: Not using a condom the right way or not using a condom every time you have sex. Having a new sex partner or having more than one sex partner. Being sexually active before age 25. What are the signs or symptoms? In some cases, there are no symptoms, especially early in the infection. If symptoms develop, they may include: Urinating often, or a burning feeling during urination. Redness, soreness, or swelling of the vagina or rectum. Discharge coming from the vagina or rectum. Pain in the abdomen. Pain during sex. Bleeding between menstrual periods or irregular periods. How is this diagnosed? This condition may be diagnosed with: Urine tests. Swab tests. Depending on your symptoms, your health care provider may use a cotton swab to collect a fluid sample from your vagina, rectum, nose, or throat to test for the bacteria. A pelvic exam. How is this treated? This condition is  treated with antibiotic medicines. Follow these instructions at home: Sexual activity Tell your sex partner or partners about your infection. These include any partners for oral, anal, or vaginal sex that you have had within 60 days of when your symptoms started. Sex partners should also be treated, even if they have no signs of the infection. Do not have sex until you and your sex partners have completed treatment and your health care provider says it is okay. If your health care provider prescribed you a single-dose medicine as treatment, wait at least 7 days after taking the medicine before having sex. General instructions Take over-the-counter and prescription medicines as told by your health care provider. Finish all antibiotic medicine even when you start to feel better. It is up to you to get your test results. Ask your health care provider, or the department that is doing the test, when your results will be ready. Keep all follow-up visits. This is important. You may need to be tested for infection again 3 months after treatment. How is this prevented? You can lower your risk of getting chlamydia by: Using latex or polyurethane condoms correctly every time you have sex. Not having multiple sex partners. Asking if your sex partner has been tested for STIs and had negative results. Getting regular health screenings to check for STIs. Contact a health care provider if: You develop new symptoms or your symptoms are getting worse. Your symptoms do not get better after treatment. You have a fever or chills. You have pain during sex. You have irregular menstrual periods, or you have bleeding between periods or after sex. You develop flu-like symptoms, such as night sweats, sore throat,   or muscle aches. You are unable to take your antibiotic medicine as prescribed. Summary Chlamydia is a sexually transmitted infection (STI) that is caused by bacteria. This infection spreads through sexual  contact. This condition is treated with antibiotic medicines. If left untreated, chlamydia can lead to more serious health problems, including pelvic inflammatory disease (PID). Your sex partners will also need to be treated. Do not have sex until both you and your partner have been treated. Take medicines as directed by your health care provider and keep all follow-up visits to ensure your infection has been completely treated. This information is not intended to replace advice given to you by your health care provider. Make sure you discuss any questions you have with your health care provider. Document Revised: 10/05/2021 Document Reviewed: 10/05/2021 Elsevier Patient Education  2023 [employer] Gonorrhea Gonorrhea is a sexually transmitted infection (STI) that can infect any person. If left untreated, this infection can: Damage the reproductive organs. Spread to other parts of the body. Cause someone to be unable to have children (infertility). Harm an unborn baby if an infected person is pregnant. It is important to get treatment for gonorrhea as soon as possible. All of your sex partners may also need to be treated for the infection. What are the causes? This condition is caused by bacteria called Neisseria gonorrhoeae. The infection is spread from person to person through sexual contact, including oral, anal, and vaginal sex. The infection can also pass from a pregnant person to the baby during birth. What increases the risk? The following factors may make you more likely to develop this condition: Being a woman younger than 25 years and sexually active. Being a man who has sex with men. Having a new sex partner or having multiple partners. Having a sex partner who has an STI. Not using condoms correctly or not using condoms every time you have sex. Having a history of STIs. What are the signs or symptoms? When symptoms occur, they may include: Abnormal discharge from the penis or  vagina. The discharge may be cloudy, thick, or yellow-green in color. Pain or burning when you urinate. Itching, irritation, pain, bleeding, or discharge from the rectum. This may occur if the infection was spread by anal sex. Sore throat or swollen lymph nodes in the neck. This may occur if the infection was spread by oral sex. Pain or swelling in the testicles. Bleeding between menstrual periods. If the infection has spread to other areas of the body, symptoms may include: Fever. Eye irritation. Swelling, redness, warmth, and pain in the joints. Rashes. In some cases, there are no symptoms. How is this diagnosed? This condition is diagnosed based on: A physical exam. A swab of fluid. The swab of fluid may be taken from the penis, vagina, throat, or rectum. Urine tests. Not all test results will be available during your visit. How is this treated? This condition is treated with antibiotic medicines. It is important to start treatment as soon as possible. Early treatment may prevent some problems from developing. You should not have sex during treatment. All types of sexual activity should be avoided for at least 7 days after treatment is complete and until your sex partner or partners have been treated. Follow these instructions at home: Take over-the-counter and prescription medicines as told by your health care provider. Finish all antibiotic medicine even when you start to feel better. Do not have sex during treatment. Do not have sex until at least 7 days after you  and your partner or partners have finished treatment and your health care provider says it is okay. It is up to you to get your test results. Ask your health care provider, or the department that is doing the test, when your results will be ready. If you get a positive result on your gonorrhea test, tell your recent sex partners. These include any partners for oral, anal, or vaginal sex. They need to be checked for gonorrhea  even if they do not have symptoms. They may need treatment, even if they get negative results on their gonorrhea tests. Keep all follow-up visits. This is important. How is this prevented?  Use latex or polyurethane condoms correctly every time you have sex. Ask if your sex partner or partners have been tested for STIs and had negative results. Avoid having multiple sex partners. Get regular health screenings to check for STIs. Contact a health care provider if: Your symptoms do not get better after a few days of taking antibiotics. Your symptoms get worse. You cannot take your medicine as directed by your health care provider. You develop new symptoms, including: Eye irritation. Swelling, redness, warmth, and pain in the joints. Rashes. You have a fever. Summary Gonorrhea is a sexually transmitted infection (STI) that can infect any person. This infection is spread from person to person through sexual contact, including oral, anal, and vaginal sex. The infection can also pass from a pregnant person to the baby during birth. Symptoms include abnormal discharge, pain or burning while urinating, or pain in the rectum. This condition is treated with antibiotic medicines. Do not have sex until at least 7 days after both you and any sex partners have completed antibiotic treatment. Tell your health care provider if you have trouble taking your medicine, your symptoms get worse, or you have new symptoms. Keep all follow-up visits. This information is not intended to replace advice given to you by your health care provider. Make sure you discuss any questions you have with your health care provider. Document Revised: 11/05/2021 Document Reviewed: 11/05/2021 Elsevier Patient Education  2023 Else[employer] Inc. Bacterial Vaginosis  Bacterial vaginosis is an infection of the vagina. It happens when too many normal germs (healthy bacteria) grow in the vagina. This infection can make it easier to get  other infections from sex (STIs). It is very important for pregnant women to get treated. This infection can cause babies to be born early or at a low birth weight. What are the causes? This infection is caused by an increase in certain germs that grow in the vagina. You cannot get this infection from toilet seats, bedsheets, swimming pools, or things that touch your vagina. What increases the risk? Having sex with a new person or more than one person. Having sex without protection. Douching. Having an intrauterine device (IUD). Smoking. Using drugs or drinking alcohol. These can lead you to do things that are risky. Taking certain antibiotic medicines. Being pregnant. What are the signs or symptoms? Some women have no symptoms. Symptoms may include: A discharge from your vagina. It may be gray or white. It can be watery or foamy. A fishy smell. This can happen after sex or during your menstrual period. Itching in and around your vagina. A feeling of burning or pain when you pee (urinate). How is this treated? This infection is treated with antibiotic medicines. These may be given to you as: A pill. A cream for your vagina. A medicine that you put into your vagina (suppository). If  the infection comes back after treatment, you may need more antibiotics. Follow these instructions at home: Medicines Take over-the-counter and prescription medicines as told by your doctor. Take or use your antibiotic medicine as told by your doctor. Do not stop taking or using it, even if you start to feel better. General instructions If the person you have sex with is a woman, tell her that you have this infection. She will need to follow up with her doctor. If you have a female partner, he does not need to be treated. Do not have sex until you finish treatment. Drink enough fluid to keep your pee pale yellow. Keep your vagina and butt clean. Wash the area with warm water each day. Wipe from front to  back after you use the toilet. If you are breastfeeding a baby, ask your doctor if you should keep doing so during treatment. Keep all follow-up visits. How is this prevented? Self-care Do not douche. Use only warm water to wash around your vagina. Wear underwear that is cotton or lined with cotton. Do not wear tight pants and pantyhose, especially in the summer. Safe sex Use protection when you have sex. This includes: Use condoms. Use dental dams. This is a thin layer that protects the mouth during oral sex. Limit how many people you have sex with. To prevent this infection, it is best to have sex with just one person. Get tested for STIs. The person you have sex with should also get tested. Drugs and alcohol Do not smoke or use any products that contain nicotine or tobacco. If you need help quitting, ask your doctor. Do not use drugs. Do not drink alcohol if: Your doctor tells you not to drink. You are pregnant, may be pregnant, or are planning to become pregnant. If you drink alcohol: Limit how much you have to 0-1 drink a day. Know how much alcohol is in your drink. In the [place] one drink equals one 12 oz bottle of beer (355 mL), one 5 oz glass of wine (148 mL), or one 1 oz glass of hard liquor (44 mL). Where to find more information Centers for Disease Control and Prevention: FootballExhibition.com.br American Sexual Health Association: www.ashastd.org Office on [employer] Health: http://hoffman.com/ Contact a doctor if: Your symptoms do not get better, even after you are treated. You have more discharge or pain when you pee. You have a fever or chills. You have pain in your belly (abdomen) or in the area between your hips. You have pain with sex. You bleed from your vagina between menstrual periods. Summary This infection can happen when too many germs (bacteria) grow in the vagina. This infection can make it easier to get infections from sex (STIs). Treating this can lower that  chance. Get treated if you are pregnant. This infection can cause babies to be born early. Do not stop taking or using your antibiotic medicine, even if you start to feel better. This information is not intended to replace advice given to you by your health care provider. Make sure you discuss any questions you have with your health care provider. Document Revised: 06/12/2020 Document Reviewed: 06/12/2020 Elsevier Patient Education  2023 [employer].

## 2023-04-21 NOTE — Progress Notes (Signed)
    NURSE VISIT NOTE  Subjective:    Patient ID: Natalie Scott, female    DOB: 25-Jun-1991, 32 y.o.   MRN: [id number]  HPI  Patient is a 32 y.o. [phone] female who presents for self swab nurse visit. Has been having vaginal discharge and fishy odor for the past week. Denies vaginal itching and irritation. Denies abnormal vaginal bleeding or significant pelvic pain or fever. Denies UT symptoms. Patient does not history of known exposure to STD.   Objective:    BP 110/60   Ht  (1.676 m)   Wt 185 lb (83.9 kg)   LMP 04/11/2023 (Exact Date)   Breastfeeding No   BMI 29.86 kg/m     Assessment:   1. Screening for STD (sexually transmitted disease)   2. Vaginal discharge   3. Vaginal odor     Plan:   GC and chlamydia, BV and yeast probe sent to lab. Treatment: Will wait for results to be treated if needed. ROV prn if symptoms persist or worsen.   Donnetta Hail, CMA

## 2023-04-25 ENCOUNTER — Other Ambulatory Visit: Payer: Self-pay

## 2023-04-25 DIAGNOSIS — B9689 Other specified bacterial agents as the cause of diseases classified elsewhere: Secondary | ICD-10-CM

## 2023-04-25 LAB — CERVICOVAGINAL ANCILLARY ONLY
Bacterial Vaginitis (gardnerella): POSITIVE — AB
Candida Glabrata: NEGATIVE
Candida Vaginitis: NEGATIVE
Chlamydia: NEGATIVE
Comment: NEGATIVE
Comment: NEGATIVE
Comment: NEGATIVE
Comment: NEGATIVE
Comment: NEGATIVE
Comment: NORMAL
Neisseria Gonorrhea: NEGATIVE
Trichomonas: NEGATIVE

## 2023-04-25 MED ORDER — METRONIDAZOLE 500 MG PO TABS: 500.0000 mg | ORAL_TABLET | Freq: Two times a day (BID) | ORAL | 0 refills | Status: DC

## 2023-05-18 ENCOUNTER — Encounter: Payer: Self-pay | Admitting: Obstetrics and Gynecology

## 2023-05-18 ENCOUNTER — Ambulatory Visit (INDEPENDENT_AMBULATORY_CARE_PROVIDER_SITE_OTHER): Payer: Medicaid Other | Admitting: Obstetrics and Gynecology

## 2023-05-18 ENCOUNTER — Other Ambulatory Visit (HOSPITAL_COMMUNITY)
Admission: RE | Admit: 2023-05-18 | Discharge: 2023-05-18 | Disposition: A | Discharge status: Home / Self Care | Payer: Medicaid Other | Source: Ambulatory Visit | Attending: Obstetrics and Gynecology | Admitting: Obstetrics and Gynecology

## 2023-05-18 VITALS — BP 97/67 | HR 69 | Ht 66.0 in | Wt 188.8 lb

## 2023-05-18 DIAGNOSIS — Z124 Encounter for screening for malignant neoplasm of cervix: Secondary | ICD-10-CM

## 2023-05-18 DIAGNOSIS — Z01419 Encounter for gynecological examination (general) (routine) without abnormal findings: Secondary | ICD-10-CM | POA: Insufficient documentation

## 2023-05-18 DIAGNOSIS — Z1231 Encounter for screening mammogram for malignant neoplasm of breast: Secondary | ICD-10-CM

## 2023-05-18 DIAGNOSIS — Z803 Family history of malignant neoplasm of breast: Secondary | ICD-10-CM

## 2023-05-18 NOTE — Progress Notes (Signed)
Patients presents for annual exam today. She states heavy menstrual cycles over the past two months with moderate cramping, currently not using anything for birth control. Due for pap smear, ordered. She states no other questions or concerns at this time.

## 2023-05-18 NOTE — Progress Notes (Signed)
HPI:      Natalie Scott is a 32 y.o. [phone] who LMP was Patient's last menstrual period was 05/12/2023 (exact date).  Subjective:   She presents today for her annual examination.  She stopped breast-feeding 2 months ago and is now having normal regular cycles.  She does not desire anything for birth control.  Currently using withdrawal method.  Since she has stopped breast-feeding she has noted some weight gain.  We discussed calorie restriction. 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  No significant follow-up was performed. Patient has a family history of breast cancer-her mother had a mastectomy.    Hx: The following portions of the patient's history were reviewed and updated as appropriate:             She  has a past medical history of Anemia, Eczema, Headache, History of anemia, and UTI (urinary tract infection). She does not have any pertinent problems on file. She  has no past surgical history on file. Her family history includes Asthma in her daughter; Bipolar disorder in her sister; Breast cancer in her mother; Breast cancer (age of onset: 85) in her maternal aunt; Depression in her mother; Diabetes in an other family member; Lupus in her mother; Osteoarthritis in her paternal grandmother. She  reports that she has never smoked. She has never used smokeless tobacco. She reports current alcohol use of about 10.0 standard drinks of alcohol per week. She reports current drug use. Drug: Marijuana. She currently has no medications in their medication list. She has No Known Allergies.       Review of Systems:  Review of Systems  Constitutional: Denied constitutional symptoms, night sweats, recent illness, fatigue, fever, insomnia and weight loss.  Eyes: Denied eye symptoms, eye pain, photophobia, vision change and visual disturbance.  Ears/Nose/Throat/Neck: Denied ear, nose, throat or neck symptoms, hearing loss, nasal discharge,  sinus congestion and sore throat.  Cardiovascular: Denied cardiovascular symptoms, arrhythmia, chest pain/pressure, edema, exercise intolerance, orthopnea and palpitations.  Respiratory: Denied pulmonary symptoms, asthma, pleuritic pain, productive sputum, cough, dyspnea and wheezing.  Gastrointestinal: Denied, gastro-esophageal reflux, melena, nausea and vomiting.  Genitourinary: Denied genitourinary symptoms including symptomatic vaginal discharge, pelvic relaxation issues, and urinary complaints.  Musculoskeletal: Denied musculoskeletal symptoms, stiffness, swelling, muscle weakness and myalgia.  Dermatologic: Denied dermatology symptoms, rash and scar.  Neurologic: Denied neurology symptoms, dizziness, headache, neck pain and syncope.  Psychiatric: Denied psychiatric symptoms, anxiety and depression.  Endocrine: Denied endocrine symptoms including hot flashes and night sweats.   Meds:   No current outpatient medications on file prior to visit.   No current facility-administered medications on file prior to visit.     Objective:     Vitals:   05/18/23 0931  BP: 97/67  Pulse: 69    Filed Weights   05/18/23 0931  Weight: 188 lb 12.8 oz (85.6 kg)              Physical examination General NAD, Conversant  HEENT Atraumatic; Op clear with mmm.  Normo-cephalic. Pupils reactive. Anicteric sclerae  Thyroid/Neck Smooth without nodularity or enlargement. Normal ROM.  Neck Supple.  Skin No rashes, lesions or ulceration. Normal palpated skin turgor. No nodularity.  Breasts: No masses or discharge.  Symmetric.  No axillary adenopathy.  Lungs: Clear to auscultation.No rales or wheezes. Normal Respiratory effort, no retractions.  Heart: NSR.  No murmurs or rubs appreciated. No peripheral edema  Abdomen: Soft.  Non-tender.  No  masses.  No HSM. No hernia  Extremities: Moves all appropriately.  Normal ROM for age. No lymphadenopathy.  Neuro: Oriented to PPT.  Normal mood. Normal affect.      Pelvic:   Vulva: Normal appearance.  No lesions.  Vagina: No lesions or abnormalities noted.  Support: Normal pelvic support.  Urethra No masses tenderness or scarring.  Meatus Normal size without lesions or prolapse.  Cervix: Normal appearance.  No lesions.  Anus: Normal exam.  No lesions.  Perineum: Normal exam.  No lesions.        Bimanual   Uterus: Normal size.  Non-tender.  Mobile.  AV.  Adnexae: No masses.  Non-tender to palpation.  Cul-de-sac: Negative for abnormality.     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 Well woman exam with routine gynecological exam   2. Cervical cancer screening   3. Screening mammogram for breast cancer   4. Family history of breast cancer     Normal exam  History of abnormal Pap-very important to have follow-up Paps especially with type 16  Primary relative with breast cancer   Plan:            1.  Basic Screening Recommendations The basic screening recommendations for asymptomatic women were discussed with the patient during her visit.  The age-appropriate recommendations were discussed with her and the rational for the tests reviewed.  When I am informed by the patient that another primary care physician has previously obtained the age-appropriate tests and they are up-to-date, only outstanding tests are ordered and referrals given as necessary.  Abnormal results of tests will be discussed with her when all of her results are completed.  Routine preventative health maintenance measures emphasized: Exercise/Diet/Weight control, Tobacco Warnings, Alcohol/Substance use risks and Stress Management Pap performed-mammogram ordered 2.  Discussed birth control and patient declined at this time. 3.  Discussed caloric restriction and the effect of weight gain after discontinuation of  breast-feeding.  Orders Orders Placed This Encounter  Procedures   MM Digital Diagnostic Bilat    No orders of the defined types were placed in this encounter.         F/U  Return in about 1 year (around 05/17/2024) for Annual Physical, We will contact her with any abnormal test results.  Elonda Husky, M.D. 05/18/2023 9:52 AM

## 2023-05-30 LAB — CYTOLOGY - PAP
Comment: NEGATIVE
Diagnosis: NEGATIVE
High risk HPV: NEGATIVE

## 2023-12-07 ENCOUNTER — Other Ambulatory Visit (HOSPITAL_COMMUNITY)
Admission: RE | Admit: 2023-12-07 | Discharge: 2023-12-07 | Disposition: A | Discharge status: Home / Self Care | Payer: Medicaid Other | Source: Ambulatory Visit | Attending: Obstetrics and Gynecology | Admitting: Obstetrics and Gynecology

## 2023-12-07 ENCOUNTER — Ambulatory Visit (INDEPENDENT_AMBULATORY_CARE_PROVIDER_SITE_OTHER): Payer: Medicaid Other

## 2023-12-07 VITALS — BP 114/80 | HR 80 | Ht 67.0 in | Wt 142.0 lb

## 2023-12-07 DIAGNOSIS — N926 Irregular menstruation, unspecified: Secondary | ICD-10-CM

## 2023-12-07 DIAGNOSIS — Z3202 Encounter for pregnancy test, result negative: Secondary | ICD-10-CM

## 2023-12-07 DIAGNOSIS — N898 Other specified noninflammatory disorders of vagina: Secondary | ICD-10-CM

## 2023-12-07 DIAGNOSIS — N949 Unspecified condition associated with female genital organs and menstrual cycle: Secondary | ICD-10-CM

## 2023-12-07 LAB — POCT URINE PREGNANCY: Preg Test, Ur: NEGATIVE

## 2023-12-07 NOTE — Progress Notes (Signed)
    NURSE VISIT NOTE  Subjective:    Patient ID: Natalie Scott, female    DOB: 06-20-1991, 32 y.o.   MRN: [id number]  HPI  Patient is a 32 y.o. [phone] female who presents for vaginal irritation has a history of BV and wanted to be reassured its not Bv again, pt also asking for pregnancy test she took a plan B 3 weeks ago and had irregular bleeding.   Objective:    BP 114/80   Pulse 80   Ht 5\' 7"  (1.702 m)   Wt 142 lb (64.4 kg)   BMI 22.24 kg/m      Assessment:   1. Vaginal discomfort   2. Irregular menses       Plan:   GC and chlamydia DNA  probe sent to lab. Treatment: abstain from coitus during course of treatment ROV prn if symptoms persist or worsen.   Loney Laurence, CMA

## 2023-12-09 ENCOUNTER — Other Ambulatory Visit: Payer: Self-pay

## 2023-12-09 DIAGNOSIS — N760 Acute vaginitis: Secondary | ICD-10-CM

## 2023-12-09 LAB — CERVICOVAGINAL ANCILLARY ONLY
Bacterial Vaginitis (gardnerella): POSITIVE — AB
Candida Glabrata: NEGATIVE
Candida Vaginitis: NEGATIVE
Chlamydia: NEGATIVE
Comment: NEGATIVE
Comment: NEGATIVE
Comment: NEGATIVE
Comment: NEGATIVE
Comment: NEGATIVE
Comment: NORMAL
Neisseria Gonorrhea: NEGATIVE
Trichomonas: NEGATIVE

## 2023-12-09 MED ORDER — METRONIDAZOLE 500 MG PO TABS
500.0000 mg | ORAL_TABLET | Freq: Two times a day (BID) | ORAL | 0 refills | Status: DC
Start: 2023-12-09 — End: 2024-05-14

## 2023-12-29 ENCOUNTER — Ambulatory Visit: Payer: Medicaid Other | Admitting: Obstetrics

## 2024-01-10 ENCOUNTER — Encounter: Payer: Self-pay | Admitting: Obstetrics

## 2024-01-10 ENCOUNTER — Ambulatory Visit (INDEPENDENT_AMBULATORY_CARE_PROVIDER_SITE_OTHER): Payer: Medicaid Other | Admitting: Obstetrics

## 2024-01-10 VITALS — BP 97/60 | HR 91 | Ht 67.0 in | Wt 139.0 lb

## 2024-01-10 DIAGNOSIS — B9689 Other specified bacterial agents as the cause of diseases classified elsewhere: Secondary | ICD-10-CM | POA: Diagnosis not present

## 2024-01-10 DIAGNOSIS — R232 Flushing: Secondary | ICD-10-CM

## 2024-01-10 DIAGNOSIS — N760 Acute vaginitis: Secondary | ICD-10-CM | POA: Diagnosis not present

## 2024-01-10 NOTE — Progress Notes (Signed)
   GYN ENCOUNTER  Subjective  HPI: Natalie Scott is a 33 y.o. [id number] who presents today for recurrent BV. She reports that she keeps getting BV and she does not know why. She has not changed any of her habits or hygiene practices. She has the same sexual partner. Both she and her partner use unscented bath products. She does report chronic stress and some difficulty maintaining a healthy diet.  Past Medical History:  Diagnosis Date   Anemia    Eczema    Headache    History of anemia    Hx: UTI (urinary tract infection)    History reviewed. No pertinent surgical history. OB History     Gravida  4   Para  3   Term  3   Preterm  0   AB  1   Living  3      SAB  0   IAB  0   Ectopic  0   Multiple  0   Live Births  3          No Known Allergies  ROS: see HPi  Objective  BP 97/60   Pulse 91   Ht 5' 7 (1.702 m)   Wt 139 lb (63 kg)   LMP 01/05/2024   Breastfeeding No   BMI 21.77 kg/m   Physical examination Declined. Not medically indicated. Swab self-collected  Assessment Recurrent BV  Plan Swab collected for BV, yeast, STIs, and ureaplasma. F/u based on results Discussed maintaining vaginal health. Recommend boric acid capsules vaginally x 21 days. HgA1C collected d/t family h/o diabetes   Eleanor Canny, CNM

## 2024-01-11 LAB — HEMOGLOBIN A1C
Est. average glucose Bld gHb Est-mCnc: 114 mg/dL
Hgb A1c MFr Bld: 5.6 % (ref 4.8–5.6)

## 2024-01-13 LAB — NUSWAB VG PLUS+MYCOPLASMAS,NAA
Candida albicans, NAA: NEGATIVE
Candida glabrata, NAA: NEGATIVE
Chlamydia trachomatis, NAA: NEGATIVE
Mycoplasma genitalium NAA: NEGATIVE
Mycoplasma hominis NAA: NEGATIVE
Neisseria gonorrhoeae, NAA: NEGATIVE
Trich vag by NAA: NEGATIVE
Ureaplasma spp NAA: NEGATIVE

## 2024-01-17 ENCOUNTER — Encounter: Payer: Self-pay | Admitting: Obstetrics

## 2024-01-23 ENCOUNTER — Telehealth: Payer: Self-pay

## 2024-01-23 NOTE — Telephone Encounter (Signed)
Patient calling in via Triage line. She states after having her menstrual cycle already this month, 01/05/24-01/09/24, and now experiencing additional bleeding that is heavy along with moderate cramping starting last night. She denies any birth control use. She states now soaking a pad every 30 minutes to an hour. Advised patient to go to ED to be evaluated, she is understanding and states she will go.

## 2024-01-24 ENCOUNTER — Emergency Department: Payer: Medicaid Other

## 2024-01-24 ENCOUNTER — Encounter: Payer: Self-pay | Admitting: Intensive Care

## 2024-01-24 ENCOUNTER — Emergency Department
Admission: EM | Admit: 2024-01-24 | Discharge: 2024-01-25 | Disposition: A | Discharge status: Home / Self Care | Payer: Medicaid Other | Attending: Emergency Medicine | Admitting: Emergency Medicine

## 2024-01-24 DIAGNOSIS — N938 Other specified abnormal uterine and vaginal bleeding: Secondary | ICD-10-CM

## 2024-01-24 DIAGNOSIS — N83202 Unspecified ovarian cyst, left side: Secondary | ICD-10-CM | POA: Insufficient documentation

## 2024-01-24 DIAGNOSIS — N939 Abnormal uterine and vaginal bleeding, unspecified: Secondary | ICD-10-CM | POA: Diagnosis present

## 2024-01-24 LAB — URINALYSIS, ROUTINE W REFLEX MICROSCOPIC
Bacteria, UA: NONE SEEN
Bilirubin Urine: NEGATIVE
Glucose, UA: NEGATIVE mg/dL
Ketones, ur: 20 mg/dL — AB
Leukocytes,Ua: NEGATIVE
Nitrite: NEGATIVE
Protein, ur: 100 mg/dL — AB
RBC / HPF: >50 RBC/hpf (ref 0–5)
Specific Gravity, Urine: 1.035 — ABNORMAL HIGH (ref 1.005–1.030)
pH: 5 (ref 5.0–8.0)

## 2024-01-24 LAB — CBC WITH DIFFERENTIAL/PLATELET
Abs Immature Granulocytes: 0.02 10*3/uL (ref 0.00–0.07)
Basophils Absolute: 0 10*3/uL (ref 0.0–0.1)
Basophils Relative: 0 %
Eosinophils Absolute: 0.1 10*3/uL (ref 0.0–0.5)
Eosinophils Relative: 2 %
HCT: 29.1 % — ABNORMAL LOW (ref 36.0–46.0)
Hemoglobin: 9.4 g/dL — ABNORMAL LOW (ref 12.0–15.0)
Immature Granulocytes: 0 %
Lymphocytes Relative: 37 %
Lymphs Abs: 2.5 10*3/uL (ref 0.7–4.0)
MCH: 26 pg (ref 26.0–34.0)
MCHC: 32.3 g/dL (ref 30.0–36.0)
MCV: 80.6 fL (ref 80.0–100.0)
Monocytes Absolute: 0.6 10*3/uL (ref 0.1–1.0)
Monocytes Relative: 9 %
Neutro Abs: 3.5 10*3/uL (ref 1.7–7.7)
Neutrophils Relative %: 52 %
Platelets: 301 10*3/uL (ref 150–400)
RBC: 3.61 MIL/uL — ABNORMAL LOW (ref 3.87–5.11)
RDW: 16.8 % — ABNORMAL HIGH (ref 11.5–15.5)
WBC: 6.7 10*3/uL (ref 4.0–10.5)
nRBC: 0 % (ref 0.0–0.2)

## 2024-01-24 LAB — BASIC METABOLIC PANEL
Anion gap: 12 (ref 5–15)
BUN: 14 mg/dL (ref 6–20)
CO2: 22 mmol/L (ref 22–32)
Calcium: 8.7 mg/dL — ABNORMAL LOW (ref 8.9–10.3)
Chloride: 105 mmol/L (ref 98–111)
Creatinine, Ser: 0.66 mg/dL (ref 0.44–1.00)
GFR, Estimated: >60 mL/min (ref >60)
Glucose, Bld: 94 mg/dL (ref 70–99)
Potassium: 3.7 mmol/L (ref 3.5–5.1)
Sodium: 139 mmol/L (ref 135–145)

## 2024-01-24 LAB — POC URINE PREG, ED: Preg Test, Ur: NEGATIVE

## 2024-01-24 MED ORDER — MEDROXYPROGESTERONE ACETATE 10 MG PO TABS: 10.0000 mg | ORAL_TABLET | Freq: Every day | ORAL | 0 refills | Status: DC

## 2024-01-24 NOTE — ED Provider Triage Note (Signed)
Emergency Medicine Provider Triage Evaluation Note  Natalie Scott , a 33 y.o. female  was evaluated in triage.  Pt complains of abnormal vaginal bleeding. Patient reports a normal LMP on 01/05/24, with a second period with onset 2 weeks ago. She denies OCP, endometriosis, fibroids, or history of menorrhagia/metrorrhagia. Gives history of anemia without need for iron infusion or RBCs.   Review of Systems  Positive: Abn vag bleeding Negative: FCS, syncope  Physical Exam  BP 107/69 (BP Location: Left Arm)   Pulse 74   Temp 98.4 F (36.9 C) (Oral)   Resp 16   Ht 5\' 7"  (1.702 m)   Wt 63 kg   LMP 01/05/2024   SpO2 100%   Breastfeeding No   BMI 21.77 kg/m  Gen:   Awake, no distress  NAD Resp:  Normal effort CTA MSK:   Moves extremities without difficulty  Other:    Medical Decision Making  Medically screening exam initiated at 6:47 PM.  Appropriate orders placed.  Natalie Scott was informed that the remainder of the evaluation will be completed by another provider, this initial triage assessment does not replace that evaluation, and the importance of remaining in the ED until their evaluation is complete.  Pt to the ED for evaluation of abnormal vaginal bleeding.    Lissa Hoard, PA-C 01/24/24 2035

## 2024-01-24 NOTE — Discharge Instructions (Addendum)
Take and finish Provera as prescribed.  Drink plenty of fluids daily.  Resume taking your iron supplements.  Return to the ER for worsening symptoms, soaking more than 1 pad per hour, fainting or other concerns.

## 2024-01-24 NOTE — ED Provider Notes (Signed)
[facility] Provider Note    Event Date/Time   First MD Initiated Contact with Patient 01/24/24 2305     (approximate)   History   Vaginal Bleeding   HPI  Nekeisha Aure is a 33 y.o. female referred to the ED by her OB/GYN for heavy vaginal bleeding x 2 weeks.  Patient reports regular menstrual cycles at baseline.  Took a morning-after pill in December 2024.  This is her second menstrual cycle since that time.  Reports heavy bleeding with clots associated with pelvic cramping.  Supposed be taking iron supplements for anemia but does not.  Had recent GYN visit with STD swab; treated for bacterial vaginosis.  Denies fever/chills, chest pain, shortness of breath, abdominal pain, nausea, vomiting or dizziness.  Denies STD concerns.     Past Medical History   Past Medical History:  Diagnosis Date   Anemia    Eczema    Headache    History of anemia    Hx: UTI (urinary tract infection)      Active Problem List   Patient Active Problem List   Diagnosis Date Noted   Postpartum depression 08/10/2022   Vitamin D deficiency 09/21/2019   Marijuana use 05/04/2016     Past Surgical History  History reviewed. No pertinent surgical history.   Home Medications   Prior to Admission medications   Medication Sig Start Date End Date Taking? Authorizing Provider  medroxyPROGESTERone (PROVERA) 10 MG tablet Take 1 tablet (10 mg total) by mouth daily. 01/24/24  Yes Irean Hong, MD  metroNIDAZOLE (FLAGYL) 500 MG tablet Take 1 tablet (500 mg total) by mouth 2 (two) times daily. 12/09/23   Linzie Collin, MD     Allergies  Patient has no known allergies.   Family History   Family History  Problem Relation Age of Onset   Depression Mother    Breast cancer Mother    Lupus Mother    Bipolar disorder Sister    Breast cancer Maternal Aunt 13   Osteoarthritis Paternal Grandmother    Asthma Daughter    Diabetes Other        maternal side   Colon cancer  Neg Hx    Cervical cancer Neg Hx    Ovarian cancer Neg Hx      Physical Exam  Triage Vital Signs: ED Triage Vitals  Encounter Vitals Group     BP 01/24/24 1817 107/69     Systolic BP Percentile --      Diastolic BP Percentile --      Pulse Rate 01/24/24 1817 74     Resp 01/24/24 1817 16     Temp 01/24/24 1817 98.4 F (36.9 C)     Temp Source 01/24/24 1817 Oral     SpO2 01/24/24 1817 100 %     Weight 01/24/24 1817 139 lb (63 kg)     Height 01/24/24 1817 5\' 7"  (1.702 m)     Head Circumference --      Peak Flow --      Pain Score 01/24/24 1821 7     Pain Loc --      Pain Education --      Exclude from Growth Chart --     Updated Vital Signs: BP 110/72 (BP Location: Right Arm)   Pulse 70   Temp 98.3 F (36.8 C) (Oral)   Resp 16   Ht 5\' 7"  (1.702 m)   Wt 63 kg   LMP 01/05/2024  SpO2 100%   Breastfeeding No   BMI 21.77 kg/m    General: Awake, no distress.  CV:  RRR.  Good peripheral perfusion.  Resp:  Normal effort.  CTAB. Abd:  Nontender to light or deep palpation.  No distention.  Other:  No truncal vesicles.   ED Results / Procedures / Treatments  Labs (all labs ordered are listed, but only abnormal results are displayed) Labs Reviewed  CBC WITH DIFFERENTIAL/PLATELET - Abnormal; Notable for the following components:      Result Value   RBC 3.61 (*)    Hemoglobin 9.4 (*)    HCT 29.1 (*)    RDW 16.8 (*)    All other components within normal limits  URINALYSIS, ROUTINE W REFLEX MICROSCOPIC - Abnormal; Notable for the following components:   Color, Urine YELLOW (*)    APPearance HAZY (*)    Specific Gravity, Urine 1.035 (*)    Hgb urine dipstick LARGE (*)    Ketones, ur 20 (*)    Protein, ur 100 (*)    All other components within normal limits  BASIC METABOLIC PANEL - Abnormal; Notable for the following components:   Calcium 8.7 (*)    All other components within normal limits  HCG, QUANTITATIVE, PREGNANCY  POC URINE PREG, ED      EKG  None   RADIOLOGY I have independently visualized and interpreted patient's imaging study as well as noted the radiology interpretation:  Pelvic ultrasound: Mild heterogeneity within the uterus which may represent leiomyomatous involvement, left ovarian cyst simple  Official radiology report(s): US PELVIC COMPLETE WITH TRANSVAGINAL Result Date: 01/24/2024 CLINICAL DATA:  Heavy vaginal bleeding for 2 weeks, history of morning after pill in December of 2024 EXAM: TRANSABDOMINAL AND TRANSVAGINAL ULTRASOUND OF PELVIS TECHNIQUE: Both transabdominal and transvaginal ultrasound examinations of the pelvis were performed. Transabdominal technique was performed for global imaging of the pelvis including uterus, ovaries, adnexal regions, and pelvic cul-de-sac. It was necessary to proceed with endovaginal exam following the transabdominal exam to visualize the ovaries. COMPARISON:  None Available. FINDINGS: Uterus Measurements: 11.4 x 5.5 x 5.6 cm. = volume: 180 mL. Heterogeneity is noted in the anterior myometrium on the left near the fundus. This may represent leiomyomatous involvement although no discrete mass is seen. Nabothian cysts are noted. Endometrium Thickness: 5 mm.  No focal abnormality visualized. Right ovary Measurements: 2.6 x 1.1 x 1.5 cm. = volume: 2.3 mL. Normal appearance/no adnexal mass. Left ovary Measurements: 4.0 x 1.9 x 2.5 cm. = volume: 9.7 mL. Simple cyst is noted measuring 2.6 cm. Other findings No abnormal free fluid. IMPRESSION: Mild heterogeneity within the uterus which may represent leiomyomatous involvement. No discrete mass is noted. Ovarian follicle measuring 2.6 cm, normal finding. No follow-up imaging is recommended. Reference: Radiology 2019 Nov;293(2):359-371 Electronically Signed   By: Alcide Clever M.D.   On: 01/24/2024 22:40     PROCEDURES:  Critical Care performed: No  Procedures Pelvic exam: External exam unremarkable without rashes, lesions or vesicles.   Speculum exam demonstrates mild vaginal bleeding in vaginal canal.  Bimanual exam unremarkable.  MEDICATIONS ORDERED IN ED: Medications - No data to display   IMPRESSION / MDM / ASSESSMENT AND PLAN / ED COURSE  I reviewed the triage vital signs and the nursing notes.                             33 year old female presenting with abnormal uterine bleeding. Differential diagnosis includes,  but is not limited to, ovarian cyst, ovarian torsion, acute appendicitis, diverticulitis, urinary tract infection/pyelonephritis, endometriosis, bowel obstruction, colitis, renal colic, gastroenteritis, hernia, fibroids, endometriosis, pregnancy related pain including ectopic pregnancy, etc. I personally reviewed patient's records and note her telephone conversation with her GYN from yesterday.  Patient's presentation is most consistent with acute complicated illness / injury requiring diagnostic workup.  Laboratory results demonstrate stable anemia 9.4 compared with labs taken 1 year ago, unremarkable electrolytes, negative pregnancy test, ketonuria without infection in her urine.  Ultrasound demonstrates simple left ovarian cyst.  Discussed and offered a 10-day course of Provera for the patient which she accepts.  She will follow-up closely with her OB/GYN.  Strict return precautions given.  Patient verbalized understanding and agrees with plan of care.      FINAL CLINICAL IMPRESSION(S) / ED DIAGNOSES   Final diagnoses:  Dysfunctional uterine bleeding  Cyst of left ovary     Rx / DC Orders   ED Discharge Orders          Ordered    medroxyPROGESTERone (PROVERA) 10 MG tablet  Daily        01/24/24 2353             Note:  This document was prepared using Dragon voice recognition software and may include unintentional dictation errors.   Irean Hong, MD 01/25/24 [phone]

## 2024-01-24 NOTE — ED Triage Notes (Addendum)
Patient reports heavy vaginal bleeding X2 weeks. Told to come to ER by OBGYN. Does not take BC.  Has some abdominal cramping. Reports taking a morning after pill December 2024

## 2024-01-25 NOTE — Telephone Encounter (Signed)
TRIAGE VOICEMAIL: Patient following up regarding her ED visit yesterday.

## 2024-01-25 NOTE — ED Notes (Signed)
Called pt to pick up prinked prescription - pt states will come pick up today.

## 2024-01-25 NOTE — Telephone Encounter (Signed)
Called cell and home #'s. Received a recording for both that the number you called is not in service. Please check the number and try your call again later. My chart message sent.

## 2024-01-25 NOTE — Telephone Encounter (Signed)
Chart reviewed. ED notes states:  Laboratory results demonstrate stable anemia 9.4 compared with labs taken 1 year ago, unremarkable electrolytes, negative pregnancy test, ketonuria without infection in her urine. Ultrasound demonstrates simple left ovarian cyst. Discussed and offered a 10-day course of Provera for the patient which she accepts. She will follow-up closely with her OB/GYN. Strict return precautions given. Patient verbalized understanding and agrees with plan of care.

## 2024-05-14 ENCOUNTER — Ambulatory Visit (INDEPENDENT_AMBULATORY_CARE_PROVIDER_SITE_OTHER): Admitting: Obstetrics

## 2024-05-14 ENCOUNTER — Encounter: Payer: Self-pay | Admitting: Obstetrics

## 2024-05-14 VITALS — BP 108/69 | HR 89 | Ht 67.0 in | Wt 131.0 lb

## 2024-05-14 DIAGNOSIS — D649 Anemia, unspecified: Secondary | ICD-10-CM

## 2024-05-14 DIAGNOSIS — B9689 Other specified bacterial agents as the cause of diseases classified elsewhere: Secondary | ICD-10-CM | POA: Diagnosis not present

## 2024-05-14 DIAGNOSIS — N898 Other specified noninflammatory disorders of vagina: Secondary | ICD-10-CM

## 2024-05-14 DIAGNOSIS — D500 Iron deficiency anemia secondary to blood loss (chronic): Secondary | ICD-10-CM

## 2024-05-14 DIAGNOSIS — R1010 Upper abdominal pain, unspecified: Secondary | ICD-10-CM

## 2024-05-14 DIAGNOSIS — R5383 Other fatigue: Secondary | ICD-10-CM

## 2024-05-14 DIAGNOSIS — R634 Abnormal weight loss: Secondary | ICD-10-CM

## 2024-05-14 DIAGNOSIS — N760 Acute vaginitis: Secondary | ICD-10-CM

## 2024-05-14 MED ORDER — ETONOGESTREL-ETHINYL ESTRADIOL 0.12-0.015 MG/24HR VA RING: VAGINAL_RING | VAGINAL | 12 refills | Status: AC

## 2024-05-14 NOTE — Progress Notes (Signed)
   GYN ENCOUNTER  Subjective  HPI: Natalie Scott is a 33 y.o. [id number] who presents today for recurrent BV. She reports that when her period ends, she has increased malodorous discharge that lasts until her periods starts again. She also has very heavy periods and was recently seen in the ED. She was prescribed progesterone but did not have it filled because she was concerned about heavier bleeding based on the instructions she was given. She has tried Depo and OCPs in the past without improvement in her bleeding. She had an IUD in the past, but removal was very painful. Tabita has lost 57 lbs in the past year without trying and 8 lbs since her last visit in January. She reports that her appetite has been poor since she stopped breastfeeding. She has abdominal pain when she eats. She reports a h/o H.pylori and did not complete the treatment.   Past Medical History:  Diagnosis Date   Anemia    Eczema    Headache    History of anemia    Hx: UTI (urinary tract infection)    History reviewed. No pertinent surgical history. OB History     Gravida  4   Para  3   Term  3   Preterm  0   AB  1   Living  3      SAB  0   IAB  0   Ectopic  0   Multiple  0   Live Births  3          No Known Allergies  Constitutional: Denied night sweats, recent illness, fever. +fatigue and weight loss.  Eyes: Denied eye symptoms, eye pain, photophobia, vision change and visual disturbance.  Ears/Nose/Throat/Neck: Denied ear, nose, throat or neck symptoms, hearing loss, nasal discharge, sinus congestion and sore throat.  Cardiovascular: Denied cardiovascular symptoms, arrhythmia, chest pain/pressure, edema, exercise intolerance, orthopnea and palpitations.  Respiratory: Denied pulmonary symptoms, asthma, pleuritic pain, productive sputum, cough, dyspnea and wheezing.  Gastrointestinal: +Poor appetite, nausea, abdominal pain, soft stools  Genitourinary: Denied genitourinary symptoms including  symptomatic vaginal discharge, pelvic relaxation issues, and urinary complaints.  Musculoskeletal: Denied musculoskeletal symptoms, stiffness, swelling, muscle weakness and myalgia.  Dermatologic: Denied dermatology symptoms, rash and scar.  Neurologic: Denied neurology symptoms, dizziness, headache, neck pain and syncope.  Psychiatric: Denied psychiatric symptoms, anxiety and depression.  Endocrine: Denied endocrine symptoms including hot flashes and night sweats.    Objective  BP 108/69   Pulse 89   Ht 5\' 7"  (1.702 m)   Wt 131 lb (59.4 kg)   LMP  (LMP Unknown)   Breastfeeding No   BMI 20.52 kg/m   Physical examination Declined  Assessment -Recurrent BV -Anemia d/t heavy menstrual bleeding -Fatigue -Unexpected weight loss  Plan -Swab collected. Discussed lifestyle changes for optimal vaginal health, condom use, boric acid, probiotics, regimen for recurrent BV -CBC, iron, TIBC, ferritin today. Recommend iron supplementation. Consider iron infusion. Discussed contraceptive management of heavy menstrual bleeding. She has had no improvement in bleeding from IUD, Depo, and COCs. Would like to try NuvaRing. Instructions given and rx sent.  -Vitamin B12 and D collected today -Encouraged Boost or other calorie-dense snacks. GI referral sent d/t history of H. pylori and continued abdominal pain/poor appetite. Recent A1C was WNL. TSH/T4, CMP collected today. -F/u in 3 months  Josue Nip, CNM

## 2024-05-15 LAB — COMPREHENSIVE METABOLIC PANEL WITH GFR
ALT: 9 IU/L (ref 0–32)
AST: 17 IU/L (ref 0–40)
Albumin: 4.5 g/dL (ref 3.9–4.9)
Alkaline Phosphatase: 51 IU/L (ref 44–121)
BUN/Creatinine Ratio: 19 (ref 9–23)
BUN: 13 mg/dL (ref 6–20)
Bilirubin Total: 0.3 mg/dL (ref 0.0–1.2)
CO2: 21 mmol/L (ref 20–29)
Calcium: 9.3 mg/dL (ref 8.7–10.2)
Chloride: 102 mmol/L (ref 96–106)
Creatinine, Ser: 0.7 mg/dL (ref 0.57–1.00)
Globulin, Total: 2.4 g/dL (ref 1.5–4.5)
Glucose: 107 mg/dL — ABNORMAL HIGH (ref 70–99)
Potassium: 4.7 mmol/L (ref 3.5–5.2)
Sodium: 136 mmol/L (ref 134–144)
Total Protein: 6.9 g/dL (ref 6.0–8.5)
eGFR: 116 mL/min/{1.73_m2} (ref >59)

## 2024-05-15 LAB — CBC
Hematocrit: 33 % — ABNORMAL LOW (ref 34.0–46.6)
Hemoglobin: 10.1 g/dL — ABNORMAL LOW (ref 11.1–15.9)
MCH: 24.7 pg — ABNORMAL LOW (ref 26.6–33.0)
MCHC: 30.6 g/dL — ABNORMAL LOW (ref 31.5–35.7)
MCV: 81 fL (ref 79–97)
Platelets: 308 10*3/uL (ref 150–450)
RBC: 4.09 x10E6/uL (ref 3.77–5.28)
RDW: 15.4 % (ref 11.7–15.4)
WBC: 7.8 10*3/uL (ref 3.4–10.8)

## 2024-05-15 LAB — VITAMIN B12: Vitamin B-12: 639 pg/mL (ref 232–1245)

## 2024-05-15 LAB — VITAMIN D 25 HYDROXY (VIT D DEFICIENCY, FRACTURES): Vit D, 25-Hydroxy: 13.2 ng/mL — ABNORMAL LOW (ref 30.0–100.0)

## 2024-05-15 LAB — TSH+FREE T4
Free T4: 0.96 ng/dL (ref 0.82–1.77)
TSH: 0.889 u[IU]/mL (ref 0.450–4.500)

## 2024-05-15 LAB — IRON,TIBC AND FERRITIN PANEL
Ferritin: 8 ng/mL — ABNORMAL LOW (ref 15–150)
Iron Saturation: 7 % — CL (ref 15–55)
Iron: 27 ug/dL (ref 27–159)
Total Iron Binding Capacity: 395 ug/dL (ref 250–450)
UIBC: 368 ug/dL (ref 131–425)

## 2024-05-16 ENCOUNTER — Other Ambulatory Visit: Payer: Self-pay | Admitting: Obstetrics

## 2024-05-16 ENCOUNTER — Ambulatory Visit: Payer: Self-pay | Admitting: Obstetrics

## 2024-05-16 DIAGNOSIS — D500 Iron deficiency anemia secondary to blood loss (chronic): Secondary | ICD-10-CM | POA: Insufficient documentation

## 2024-05-16 MED ORDER — SODIUM CHLORIDE 0.9 % IV SOLN: 200.0000 mg | Freq: Once | INTRAVENOUS | Status: DC

## 2024-05-16 MED ORDER — VITAMIN D (ERGOCALCIFEROL) 1.25 MG (50000 UNIT) PO CAPS: 50000.0000 [IU] | ORAL_CAPSULE | ORAL | 0 refills | Status: AC

## 2024-05-16 NOTE — Progress Notes (Signed)
 LVM to discuss lab results. MyChart message sent with recommendation for iron infusion. Orders placed, SDS called. Rx sent for vitamin D .  Darbi Chandran M Kristan Brummitt, CNM

## 2024-05-17 ENCOUNTER — Ambulatory Visit
Admission: RE | Admit: 2024-05-17 | Discharge: 2024-05-17 | Disposition: A | Discharge status: Home / Self Care | Source: Ambulatory Visit | Attending: Obstetrics | Admitting: Obstetrics

## 2024-05-17 ENCOUNTER — Telehealth: Payer: Self-pay

## 2024-05-17 DIAGNOSIS — D509 Iron deficiency anemia, unspecified: Secondary | ICD-10-CM | POA: Insufficient documentation

## 2024-05-17 LAB — NUSWAB VG PLUS+MYCOPLASMAS,NAA
Candida albicans, NAA: NEGATIVE
Candida glabrata, NAA: NEGATIVE
Chlamydia trachomatis, NAA: NEGATIVE
Mycoplasma genitalium NAA: NEGATIVE
Mycoplasma hominis NAA: POSITIVE — AB
Neisseria gonorrhoeae, NAA: NEGATIVE
Trich vag by NAA: POSITIVE — AB
Ureaplasma spp NAA: POSITIVE — AB

## 2024-05-17 MED ORDER — IRON SUCROSE 200 MG IVPB - SIMPLE MED
200.0000 mg | Freq: Once | Status: AC
  Administered 2024-05-17: 200 mg via INTRAVENOUS
  Filled 2024-05-17: qty 110

## 2024-05-17 NOTE — Telephone Encounter (Signed)
 Pt calling back to go over results. I let her know I was not sure what to send in for her medication wise but that I would let her provider know her results were back for the swab.

## 2024-05-18 ENCOUNTER — Other Ambulatory Visit: Payer: Self-pay | Admitting: Obstetrics

## 2024-05-18 ENCOUNTER — Encounter: Payer: Self-pay | Admitting: Obstetrics

## 2024-05-18 DIAGNOSIS — N898 Other specified noninflammatory disorders of vagina: Secondary | ICD-10-CM

## 2024-05-18 MED ORDER — DOXYCYCLINE HYCLATE 100 MG PO CAPS: 100.0000 mg | ORAL_CAPSULE | Freq: Two times a day (BID) | ORAL | 0 refills | Status: DC

## 2024-05-18 MED ORDER — METRONIDAZOLE 500 MG PO TABS
500.0000 mg | ORAL_TABLET | Freq: Two times a day (BID) | ORAL | 0 refills | Status: AC
Start: 2024-05-18 — End: ?

## 2024-05-18 NOTE — Progress Notes (Signed)
+  trich, ureaplasma, mycoplasma. Metronidazole  500 mig BID x 7 days and doxycycline 100 mg BID x 7 days sent. Mallisa informed via MyChart  Phylliss Brenner, CNM

## 2024-05-22 NOTE — Telephone Encounter (Signed)
Pt aware.

## 2024-05-28 ENCOUNTER — Emergency Department
Admission: EM | Admit: 2024-05-28 | Discharge: 2024-05-28 | Disposition: A | Discharge status: Home / Self Care | Attending: Emergency Medicine | Admitting: Emergency Medicine

## 2024-05-28 ENCOUNTER — Other Ambulatory Visit: Payer: Self-pay

## 2024-05-28 DIAGNOSIS — R634 Abnormal weight loss: Secondary | ICD-10-CM | POA: Insufficient documentation

## 2024-05-28 DIAGNOSIS — N939 Abnormal uterine and vaginal bleeding, unspecified: Secondary | ICD-10-CM

## 2024-05-28 DIAGNOSIS — D509 Iron deficiency anemia, unspecified: Secondary | ICD-10-CM | POA: Insufficient documentation

## 2024-05-28 LAB — BASIC METABOLIC PANEL WITH GFR
Anion gap: 6 (ref 5–15)
BUN: 11 mg/dL (ref 6–20)
CO2: 25 mmol/L (ref 22–32)
Calcium: 9.1 mg/dL (ref 8.9–10.3)
Chloride: 108 mmol/L (ref 98–111)
Creatinine, Ser: 0.73 mg/dL (ref 0.44–1.00)
GFR, Estimated: >60 mL/min (ref >60)
Glucose, Bld: 99 mg/dL (ref 70–99)
Potassium: 3.9 mmol/L (ref 3.5–5.1)
Sodium: 139 mmol/L (ref 135–145)

## 2024-05-28 LAB — PREGNANCY, URINE: Preg Test, Ur: NEGATIVE

## 2024-05-28 LAB — CBC
HCT: 32.4 % — ABNORMAL LOW (ref 36.0–46.0)
Hemoglobin: 10.2 g/dL — ABNORMAL LOW (ref 12.0–15.0)
MCH: 24.9 pg — ABNORMAL LOW (ref 26.0–34.0)
MCHC: 31.5 g/dL (ref 30.0–36.0)
MCV: 79 fL — ABNORMAL LOW (ref 80.0–100.0)
Platelets: 281 10*3/uL (ref 150–400)
RBC: 4.1 MIL/uL (ref 3.87–5.11)
RDW: 18.5 % — ABNORMAL HIGH (ref 11.5–15.5)
WBC: 5.7 10*3/uL (ref 4.0–10.5)
nRBC: 0 % (ref 0.0–0.2)

## 2024-05-28 MED ORDER — FERROUS SULFATE 325 (65 FE) MG PO TBEC
325.0000 mg | DELAYED_RELEASE_TABLET | Freq: Two times a day (BID) | ORAL | 2 refills | Status: AC
Start: 2024-05-28 — End: 2024-08-26

## 2024-05-28 MED ORDER — ONDANSETRON 4 MG PO TBDP
4.0000 mg | ORAL_TABLET | Freq: Three times a day (TID) | ORAL | 0 refills | Status: AC | PRN
Start: 2024-05-28 — End: ?

## 2024-05-28 NOTE — Discharge Instructions (Addendum)
 You are seen in the emergency department for ongoing vaginal bleeding.  Your hemoglobin was stable and at your normal at 10.2.  Your lab work was otherwise normal.  Your pregnancy test was negative.  Place your NuvaRing as discussed with your gynecologist.  You are given a prescription for iron  supplements to take on a daily basis.  zofran  (ondansetron ) - nausea medication, take 1 tablet every 8 hours as needed for nausea/vomiting.  Return to the emergency department for any worsening symptoms.  Follow-up with your primary care physician tomorrow.

## 2024-05-28 NOTE — ED Provider Notes (Signed)
 [facility] Provider Note    Event Date/Time   First MD Initiated Contact with Patient 05/28/24 [phone]     (approximate)   History   Vaginal Bleeding   HPI  Natalie Scott is a 33 y.o. female G4, P3 who presents to the emergency department with vaginal bleeding.  Patient states that she has been having ongoing vaginal bleeding and has been followed by gynecology.  Also endorses weight loss and states that she has been followed by her primary care physician for ongoing management.  States that she has been having ongoing vaginal bleeding.  Was prescribed a NuvaRing but by her gynecologist but states that has not yet started it.  Heavier than her normal menstrual cycle.  Just states that she feels tired from all the bleeding.  Denies significant concern for pregnancy.  Denies dysuria, urinary urgency or frequency.  Denies any flank pain.  No chest pain or shortness of breath.  Not on anticoagulation.  Marijuana use but denies any tobacco or nicotine  use.  Currently on treatment for trichomonas.     Physical Exam   Triage Vital Signs: ED Triage Vitals  Encounter Vitals Group     BP 05/28/24 0813 109/79     Systolic BP Percentile --      Diastolic BP Percentile --      Pulse Rate 05/28/24 0813 88     Resp 05/28/24 0813 18     Temp 05/28/24 0813 98 F (36.7 C)     Temp src --      SpO2 05/28/24 0813 100 %     Weight 05/28/24 0810 128 lb (58.1 kg)     Height 05/28/24 0810 5\' 7"  (1.702 m)     Head Circumference --      Peak Flow --      Pain Score 05/28/24 0809 5     Pain Loc --      Pain Education --      Exclude from Growth Chart --     Most recent vital signs: Vitals:   05/28/24 0813  BP: 109/79  Pulse: 88  Resp: 18  Temp: 98 F (36.7 C)  SpO2: 100%    Physical Exam Constitutional:      Appearance: She is well-developed.  HENT:     Head: Atraumatic.  Eyes:     Extraocular Movements: Extraocular movements intact.     Conjunctiva/sclera:  Conjunctivae normal.     Pupils: Pupils are equal, round, and reactive to light.  Cardiovascular:     Rate and Rhythm: Regular rhythm.  Pulmonary:     Effort: No respiratory distress.  Abdominal:     General: There is no distension.     Tenderness: There is no abdominal tenderness. There is no right CVA tenderness or left CVA tenderness.  Musculoskeletal:        General: Normal range of motion.     Cervical back: Normal range of motion.  Skin:    General: Skin is warm.     Capillary Refill: Capillary refill takes less than 2 seconds.  Neurological:     Mental Status: She is alert. Mental status is at baseline.      IMPRESSION / MDM / ASSESSMENT AND PLAN / ED COURSE  I reviewed the triage vital signs and the nursing notes.  Differential diagnosis including abnormal uterine bleeding, hyperthyroidism, pregnancy, malignancy, anemia  Bedside ultrasound with no signs of free fluid, no obvious ovarian cyst.  Thickened endometrium.  No  signs of IUP  Labs (all labs ordered are listed, but only abnormal results are displayed) Labs interpreted as -    Labs Reviewed  CBC - Abnormal; Notable for the following components:      Result Value   Hemoglobin 10.2 (*)    HCT 32.4 (*)    MCV 79.0 (*)    MCH 24.9 (*)    RDW 18.5 (*)    All other components within normal limits  BASIC METABOLIC PANEL WITH GFR  PREGNANCY, URINE    On chart review patient has had a significant workup with primary care including checking thyroid  levels.  Does have a history of iron  deficiency anemia.  Hemoglobin today is 10.2 which is at her baseline, does appear microcytic.  Discussed starting the NuvaRing with the patient.  Discussed close follow-up with primary care physician for ongoing evaluation for weight loss.  Discussed and encouraged on marijuana cessation as likely adding to her decreased appetite and anxiety.  Discussed return for any ongoing or worsening symptoms.  Discussed starting iron  supplements.        PROCEDURES:  Critical Care performed: No  Ultrasound ED OB Pelvic  Date/Time: 05/28/2024 9:02 AM  Performed by: Viviano Ground, MD Authorized by: Viviano Ground, MD   Procedure details:    Indications: evaluate for IUP and non-obstetric vaginal bleeding     Assess:  Ovarian cyst and intrauterine pregnancy   Technique:  Transabdominal GYN (HCG-) exam   Images: not archived   Study Limitations: bowel gas Uterine findings:    Endometrial stripe: identified     Intrauterine pregnancy: not identified      Left ovary findings:    Left ovary:  Not visualized    Right ovary findings:     Right ovary:  Not visualized    Other findings:    Free pelvic fluid: not identified     Free peritoneal fluid: not identified     Patient's presentation is most consistent with acute complicated illness / injury requiring diagnostic workup.   MEDICATIONS ORDERED IN ED: Medications - No data to display  FINAL CLINICAL IMPRESSION(S) / ED DIAGNOSES   Final diagnoses:  Vaginal bleeding  Iron  deficiency anemia, unspecified iron  deficiency anemia type     Rx / DC Orders   ED Discharge Orders          Ordered    ferrous sulfate 325 (65 FE) MG EC tablet  2 times daily        05/28/24 0904    ondansetron  (ZOFRAN -ODT) 4 MG disintegrating tablet  Every 8 hours PRN        05/28/24 8119             Note:  This document was prepared using Dragon voice recognition software and may include unintentional dictation errors.   Viviano Ground, MD 05/28/24 [phone]

## 2024-05-28 NOTE — ED Notes (Signed)
 See triage notes. Patient c/o vaginal bleeding for the past two weeks. Patient stated she has a hx of anemia and cyst on her ovaries. Patient had an iron  transfusion last Monday. Patient recently diagnosed with trich and has one dose of meds left.

## 2024-05-28 NOTE — ED Triage Notes (Addendum)
 Pt comes with vaginal bleeding that started week ago. Pt states cramping. Pt states US  month ago and had cyst on her ovarian. Pt states hx of anemia and had iron  transfusion last Monday. Pt states recently dx with trich as well but has one dose of meds left.

## 2024-06-18 MED ORDER — FLUCONAZOLE 150 MG PO TABS: 150.0000 mg | ORAL_TABLET | Freq: Once | ORAL | 1 refills | Status: AC

## 2024-06-18 NOTE — Telephone Encounter (Signed)
 TRIAGE VOICEMAIL: Patient states she recently finished medications and now has a yeast infection. Requesting rx for yeast infection.

## 2024-06-18 NOTE — Telephone Encounter (Signed)
 Patient recently treated with Doxycycline  and Metronidazole  for ureaplasma/mycoplasma. Spoke to patient, she advised that she has vaginal irritation and itching. Advised will send rx for Diflucan . Also scheduled nurse visit for TOC.

## 2024-07-13 ENCOUNTER — Ambulatory Visit

## 2024-07-13 ENCOUNTER — Other Ambulatory Visit (HOSPITAL_COMMUNITY)
Admission: RE | Admit: 2024-07-13 | Discharge: 2024-07-13 | Disposition: A | Discharge status: Home / Self Care | Source: Ambulatory Visit | Attending: Obstetrics and Gynecology | Admitting: Obstetrics and Gynecology

## 2024-07-13 VITALS — BP 102/61 | HR 89 | Ht 67.0 in | Wt 128.0 lb

## 2024-07-13 DIAGNOSIS — Z113 Encounter for screening for infections with a predominantly sexual mode of transmission: Secondary | ICD-10-CM

## 2024-07-13 NOTE — Progress Notes (Signed)
    NURSE VISIT NOTE  Subjective:    Patient ID: Tauriel Scronce, female    DOB: [birth year], 33 y.o.   MRN: [id number]  HPI  Patient is a 33 y.o. [id number] female who presents for treatment of care she states she has no Sx today and has finished medication that was prescribed.  Objective:    BP 102/61   Pulse 89   Wt 128 lb (58.1 kg)   BMI 20.05 kg/m      Assessment:   1. Screening for STD (sexually transmitted disease)       Plan:   GC and chlamydia DNA  probe sent to lab.   Murle Otting H Alassane Kalafut, CMA

## 2024-07-16 LAB — CERVICOVAGINAL ANCILLARY ONLY
Bacterial Vaginitis (gardnerella): POSITIVE — AB
Candida Glabrata: NEGATIVE
Candida Vaginitis: NEGATIVE
Chlamydia: NEGATIVE
Comment: NEGATIVE
Comment: NEGATIVE
Comment: NEGATIVE
Comment: NEGATIVE
Comment: NEGATIVE
Comment: NORMAL
Neisseria Gonorrhea: NEGATIVE
Trichomonas: NEGATIVE

## 2024-07-17 ENCOUNTER — Telehealth: Payer: Self-pay

## 2024-07-19 NOTE — Telephone Encounter (Signed)
 Lvm pt did not answer

## 2024-07-20 ENCOUNTER — Other Ambulatory Visit: Payer: Self-pay

## 2024-07-20 ENCOUNTER — Ambulatory Visit

## 2024-07-20 DIAGNOSIS — N760 Acute vaginitis: Secondary | ICD-10-CM

## 2024-07-20 NOTE — Telephone Encounter (Signed)
 Pt came in for swab no other concern.

## 2024-07-22 LAB — NUSWAB VG PLUS+MYCOPLASMAS,NAA
Atopobium vaginae: HIGH {score} — AB
BVAB 2: HIGH {score} — AB
Candida albicans, NAA: NEGATIVE
Candida glabrata, NAA: NEGATIVE
Chlamydia trachomatis, NAA: NEGATIVE
Megasphaera 1: HIGH {score} — AB
Mycoplasma genitalium NAA: NEGATIVE
Mycoplasma hominis NAA: POSITIVE — AB
Neisseria gonorrhoeae, NAA: NEGATIVE
Trich vag by NAA: NEGATIVE
Ureaplasma spp NAA: POSITIVE — AB

## 2024-07-27 ENCOUNTER — Other Ambulatory Visit: Payer: Self-pay | Admitting: Obstetrics

## 2024-07-27 ENCOUNTER — Encounter: Payer: Self-pay | Admitting: Obstetrics

## 2024-07-27 MED ORDER — DOXYCYCLINE HYCLATE 100 MG PO CAPS: 100.0000 mg | ORAL_CAPSULE | Freq: Two times a day (BID) | ORAL | 0 refills | Status: AC

## 2024-07-27 MED ORDER — CLINDAMYCIN PHOSPHATE 2 % VA CREA: 1.0000 | TOPICAL_CREAM | Freq: Every day | VAGINAL | 0 refills | Status: DC

## 2024-08-02 ENCOUNTER — Ambulatory Visit: Admission: RE | Admit: 2024-08-02 | Source: Home / Self Care | Admitting: Gastroenterology

## 2024-08-02 SURGERY — COLONOSCOPY
Anesthesia: General

## 2024-08-02 NOTE — Telephone Encounter (Signed)
 Natalie Scott,  Lucrecia PA was denied:   Denied. Per the health plan's preferred drug list, at least 2 preferred drugs must be tried before requesting this drug or tell us  why the member cannot try any preferred alternatives. Please send us  supporting chart notes and lab results. Here is list of preferred alternatives: Cleocin  vaginal ovules, Clindesse vaginal cream, metronidazole  vaginal gel, Nuvessa  vaginal gel. Note: Some preferred drug(s) may have quantity limits. Refer to the health plan's preferred drug list for additional details.   Please advise.  CB

## 2024-08-04 ENCOUNTER — Other Ambulatory Visit: Payer: Self-pay | Admitting: Obstetrics

## 2024-08-04 MED ORDER — CLEOCIN 100 MG VA SUPP: 100.0000 mg | Freq: Every day | VAGINAL | 1 refills | Status: AC

## 2024-09-13 ENCOUNTER — Ambulatory Visit: Admission: RE | Admit: 2024-09-13 | Source: Home / Self Care | Admitting: Gastroenterology

## 2024-09-13 SURGERY — COLONOSCOPY
Anesthesia: General

## 2024-10-01 NOTE — Telephone Encounter (Addendum)
  Attempted to contact pt per message below. No answer, generic VM left.     ----- Message from Sharene Ruth, CNM sent at 09/28/2024  1:44 PM EDT ----- Regarding: Pelvic US  results Please call patient to let her know pelvic ultrasound is normal and unremarkable. There are no abnormalities seen. Please let patient know to schedule follow return gyn visit with me to discuss next steps. Thank you.   Sharene Ruth, CNM

## 2024-10-02 NOTE — Telephone Encounter (Addendum)
 Call placed to patient regarding pelvic ultrasound results. Pt VU and had no further questions at this time. Follow up appointment already scheduled.   ----- Message from Sharene Ruth, CNM sent at 09/28/2024  1:44 PM EDT ----- Regarding: Pelvic US  results Please call patient to let her know pelvic ultrasound is normal and unremarkable. There are no abnormalities seen. Please let patient know to schedule follow return gyn visit with me to discuss next steps. Thank you.   Sharene Ruth, CNM

## 2024-10-31 NOTE — Progress Notes (Signed)
    Provider: Sharene JONETTA Ruth, CNM Division: [facility]  GYN Problem Visit Note   Assessment/Plan    Natalie Scott is a 33 y.o. female [phone] here for problem visit.   Problem List Items Addressed This Visit       Genitourinary   Abnormal uterine bleeding - Primary   10.2.25 Pelvic US  unremarkable, reviewed with patient there is no pelvic organ abnormalities as a cause of AUB Has not started oral Aygestin  5 mg  Last periods started on 11/1, very light in flow, lasted until today. Desires to closely monitor periods over next few months w/o any interventions such as oral progestin or contraception methods         Other   Low vitamin D  level   Reports was prescribed Vitamin D  50,000 international units weekly by PCP but has not started as of yet Reviewed effects of low vitamin D  levels, advised to start as this can help with mood, energy levels, cellular activity, bone health       Relevant Orders   CBC   Ferritin   Iron  Panel   Iron  deficiency anemia   Received IV Iron  Transfusion in 04/2024 after low iron  levels obtained due to AUB Is not taking iron  supplementation Will obtain iron  levels today, encouraged oral iron  supplementation such as Vitron C to help with absorption       Family history of breast cancer in first degree relative   Mammogram was ordered, has not been scheduled. Provided imaging # to call and get scheduled      Anxiety   Has not started Lexapro  10 mg prescribed initially for anxiety Will start family therapy in [place] with her daughter and is considering individual counseling        Current Rx[1]  Orders Placed This Encounter  Procedures  . CBC  . Ferritin    Release to patient:   Immediate [1]  . Iron  Panel    Release to patient:   Immediate [1]    No follow-ups on file.    Subjective    Natalie Scott is a 33 y.o. female [phone] who presents for problem visit.  HPI: Patient is here for follow up of AUB.  Review Of Systems  A  comprehensive review of 14 systems was negative except for pertinent positives noted in HPI.    Objective    BP 107/59   Pulse 84   Wt 58.8 kg (129 lb 11.2 oz)   LMP 10/27/2024 (Exact Date)   Constitutional: Well-developed, well-nourished female in no acute distress Neurological: Alert and oriented to person, place, and time Psychiatric: Mood and affect appropriate       [1] Current Outpatient Medications  Medication Sig Dispense Refill  . escitalopram  oxalate (LEXAPRO ) 10 MG tablet Take 1 tablet (10 mg total) by mouth daily. 30 tablet 2  . naproxen (NAPROSYN) 500 MG tablet Take 1 tablet (500 mg total) by mouth in the morning and 1 tablet (500 mg total) in the evening. Take with meals. 60 tablet 1  . norethindrone  (AYGESTIN ) 5 mg tablet Take 1 tablet (5 mg total) by mouth daily. 30 tablet 2   No current facility-administered medications for this visit.

## 2024-11-02 NOTE — Telephone Encounter (Signed)
 Patient contacted nurse advice line requesting lab results. This clinical research associate assisted patient with MyChart so she could be able to see results along with message from provider regarding results. Patient VU and had no further questions or concerns at this time.

## 2024-11-13 ENCOUNTER — Other Ambulatory Visit: Payer: Self-pay | Admitting: Obstetrics
